# Patient Record
Sex: Male | Born: 1965 | Race: White | Hispanic: No | Marital: Married | Smoking: Never smoker
Health system: Southern US, Community
[De-identification: ages and names within clinical notes are randomized; demographics above are authoritative.]

## PROBLEM LIST (undated history)

## (undated) DIAGNOSIS — C159 Malignant neoplasm of esophagus, unspecified: Secondary | ICD-10-CM

## (undated) DIAGNOSIS — C801 Malignant (primary) neoplasm, unspecified: Secondary | ICD-10-CM

## (undated) DIAGNOSIS — J302 Other seasonal allergic rhinitis: Secondary | ICD-10-CM

## (undated) DIAGNOSIS — M19019 Primary osteoarthritis, unspecified shoulder: Secondary | ICD-10-CM

## (undated) DIAGNOSIS — T66XXXA Radiation sickness, unspecified, initial encounter: Secondary | ICD-10-CM

## (undated) DIAGNOSIS — I10 Essential (primary) hypertension: Secondary | ICD-10-CM

## (undated) DIAGNOSIS — M7541 Impingement syndrome of right shoulder: Secondary | ICD-10-CM

## (undated) DIAGNOSIS — M75110 Incomplete rotator cuff tear or rupture of unspecified shoulder, not specified as traumatic: Secondary | ICD-10-CM

## (undated) HISTORY — DX: Malignant neoplasm of esophagus, unspecified: C15.9

---

## 2002-10-22 DIAGNOSIS — T66XXXA Radiation sickness, unspecified, initial encounter: Secondary | ICD-10-CM

## 2002-10-22 HISTORY — DX: Radiation sickness, unspecified, initial encounter: T66.XXXA

## 2002-10-22 HISTORY — PX: THORACOTOMY: SHX5074

## 2002-10-22 HISTORY — PX: UPPER GASTROINTESTINAL ENDOSCOPY: SHX188

## 2002-10-22 HISTORY — PX: PORT-A-CATH REMOVAL: SHX5289

## 2003-02-25 ENCOUNTER — Encounter: Admission: RE | Admit: 2003-02-25 | Discharge: 2003-02-25 | Payer: Self-pay | Admitting: Internal Medicine

## 2003-02-25 ENCOUNTER — Encounter: Payer: Self-pay | Admitting: Internal Medicine

## 2003-02-26 ENCOUNTER — Encounter: Payer: Self-pay | Admitting: Gastroenterology

## 2003-02-26 ENCOUNTER — Ambulatory Visit (HOSPITAL_COMMUNITY): Admission: RE | Admit: 2003-02-26 | Discharge: 2003-02-26 | Payer: Self-pay | Admitting: Gastroenterology

## 2003-03-23 ENCOUNTER — Encounter: Payer: Self-pay | Admitting: Gastroenterology

## 2003-03-23 ENCOUNTER — Inpatient Hospital Stay (HOSPITAL_COMMUNITY): Admission: EM | Admit: 2003-03-23 | Discharge: 2003-04-09 | Payer: Self-pay | Admitting: Emergency Medicine

## 2003-03-24 ENCOUNTER — Encounter: Payer: Self-pay | Admitting: Gastroenterology

## 2003-03-25 ENCOUNTER — Encounter: Payer: Self-pay | Admitting: Gastroenterology

## 2003-03-26 ENCOUNTER — Encounter: Payer: Self-pay | Admitting: Gastroenterology

## 2003-03-28 ENCOUNTER — Encounter: Payer: Self-pay | Admitting: Gastroenterology

## 2003-03-29 ENCOUNTER — Encounter (INDEPENDENT_AMBULATORY_CARE_PROVIDER_SITE_OTHER): Payer: Self-pay | Admitting: *Deleted

## 2003-03-29 ENCOUNTER — Encounter: Payer: Self-pay | Admitting: Thoracic Surgery

## 2003-03-30 ENCOUNTER — Encounter: Payer: Self-pay | Admitting: Thoracic Surgery

## 2003-03-31 ENCOUNTER — Encounter: Payer: Self-pay | Admitting: Thoracic Surgery

## 2003-04-01 ENCOUNTER — Encounter: Payer: Self-pay | Admitting: Thoracic Surgery

## 2003-04-02 ENCOUNTER — Encounter: Payer: Self-pay | Admitting: Thoracic Surgery

## 2003-04-03 ENCOUNTER — Encounter: Payer: Self-pay | Admitting: Thoracic Surgery

## 2003-04-04 ENCOUNTER — Encounter: Payer: Self-pay | Admitting: Thoracic Surgery

## 2003-04-05 ENCOUNTER — Encounter: Payer: Self-pay | Admitting: Thoracic Surgery

## 2003-04-06 ENCOUNTER — Encounter: Payer: Self-pay | Admitting: Thoracic Surgery

## 2003-04-07 ENCOUNTER — Encounter: Payer: Self-pay | Admitting: Thoracic Surgery

## 2003-04-08 ENCOUNTER — Encounter: Payer: Self-pay | Admitting: Thoracic Surgery

## 2003-04-09 ENCOUNTER — Encounter: Payer: Self-pay | Admitting: Thoracic Surgery

## 2003-04-20 ENCOUNTER — Encounter: Payer: Self-pay | Admitting: Thoracic Surgery

## 2003-04-20 ENCOUNTER — Encounter: Admission: RE | Admit: 2003-04-20 | Discharge: 2003-04-20 | Payer: Self-pay | Admitting: Thoracic Surgery

## 2003-04-29 ENCOUNTER — Encounter: Payer: Self-pay | Admitting: Thoracic Surgery

## 2003-04-30 ENCOUNTER — Encounter: Payer: Self-pay | Admitting: Thoracic Surgery

## 2003-04-30 ENCOUNTER — Ambulatory Visit (HOSPITAL_COMMUNITY): Admission: RE | Admit: 2003-04-30 | Discharge: 2003-04-30 | Payer: Self-pay | Admitting: Thoracic Surgery

## 2003-05-10 ENCOUNTER — Ambulatory Visit (HOSPITAL_COMMUNITY): Admission: RE | Admit: 2003-05-10 | Discharge: 2003-05-10 | Payer: Self-pay | Admitting: Thoracic Surgery

## 2003-05-10 ENCOUNTER — Encounter: Payer: Self-pay | Admitting: Thoracic Surgery

## 2003-05-18 ENCOUNTER — Encounter: Payer: Self-pay | Admitting: Thoracic Surgery

## 2003-05-18 ENCOUNTER — Encounter: Admission: RE | Admit: 2003-05-18 | Discharge: 2003-05-18 | Payer: Self-pay | Admitting: Thoracic Surgery

## 2003-06-29 ENCOUNTER — Encounter: Admission: RE | Admit: 2003-06-29 | Discharge: 2003-06-29 | Payer: Self-pay | Admitting: Thoracic Surgery

## 2003-06-29 ENCOUNTER — Encounter: Payer: Self-pay | Admitting: Thoracic Surgery

## 2003-09-20 ENCOUNTER — Ambulatory Visit (HOSPITAL_COMMUNITY): Admission: RE | Admit: 2003-09-20 | Discharge: 2003-09-20 | Payer: Self-pay | Admitting: Hematology & Oncology

## 2003-09-22 ENCOUNTER — Encounter: Admission: RE | Admit: 2003-09-22 | Discharge: 2003-09-22 | Payer: Self-pay | Admitting: Thoracic Surgery

## 2003-11-08 ENCOUNTER — Ambulatory Visit (HOSPITAL_COMMUNITY): Admission: RE | Admit: 2003-11-08 | Discharge: 2003-11-08 | Payer: Self-pay | Admitting: Hematology & Oncology

## 2003-12-22 ENCOUNTER — Encounter: Admission: RE | Admit: 2003-12-22 | Discharge: 2003-12-22 | Payer: Self-pay | Admitting: Thoracic Surgery

## 2004-01-04 ENCOUNTER — Ambulatory Visit (HOSPITAL_COMMUNITY): Admission: RE | Admit: 2004-01-04 | Discharge: 2004-01-04 | Payer: Self-pay | Admitting: Hematology & Oncology

## 2004-03-23 ENCOUNTER — Encounter: Admission: RE | Admit: 2004-03-23 | Discharge: 2004-03-23 | Payer: Self-pay | Admitting: Thoracic Surgery

## 2004-03-28 ENCOUNTER — Ambulatory Visit (HOSPITAL_COMMUNITY): Admission: RE | Admit: 2004-03-28 | Discharge: 2004-03-28 | Payer: Self-pay | Admitting: Hematology & Oncology

## 2004-07-06 ENCOUNTER — Ambulatory Visit (HOSPITAL_COMMUNITY): Admission: RE | Admit: 2004-07-06 | Discharge: 2004-07-06 | Payer: Self-pay | Admitting: Hematology & Oncology

## 2004-10-05 ENCOUNTER — Ambulatory Visit (HOSPITAL_COMMUNITY): Admission: RE | Admit: 2004-10-05 | Discharge: 2004-10-05 | Payer: Self-pay | Admitting: Hematology & Oncology

## 2004-10-06 ENCOUNTER — Ambulatory Visit: Payer: Self-pay | Admitting: Hematology & Oncology

## 2005-02-08 ENCOUNTER — Ambulatory Visit (HOSPITAL_COMMUNITY): Admission: RE | Admit: 2005-02-08 | Discharge: 2005-02-08 | Payer: Self-pay | Admitting: Hematology & Oncology

## 2005-02-08 ENCOUNTER — Ambulatory Visit: Payer: Self-pay | Admitting: Hematology & Oncology

## 2005-07-12 ENCOUNTER — Ambulatory Visit (HOSPITAL_COMMUNITY): Admission: RE | Admit: 2005-07-12 | Discharge: 2005-07-12 | Payer: Self-pay | Admitting: Hematology & Oncology

## 2005-07-12 ENCOUNTER — Ambulatory Visit: Payer: Self-pay | Admitting: Hematology & Oncology

## 2006-01-23 ENCOUNTER — Ambulatory Visit: Payer: Self-pay | Admitting: Hematology & Oncology

## 2006-01-24 ENCOUNTER — Ambulatory Visit (HOSPITAL_COMMUNITY): Admission: RE | Admit: 2006-01-24 | Discharge: 2006-01-24 | Payer: Self-pay | Admitting: Hematology & Oncology

## 2006-01-25 LAB — LACTATE DEHYDROGENASE: LDH: 132 U/L (ref 94–250)

## 2006-01-25 LAB — COMPREHENSIVE METABOLIC PANEL
ALT: 12 U/L (ref 0–40)
Albumin: 4.3 g/dL (ref 3.5–5.2)
CO2: 27 mEq/L (ref 19–32)
Calcium: 9.2 mg/dL (ref 8.4–10.5)
Chloride: 105 mEq/L (ref 96–112)
Glucose, Bld: 124 mg/dL — ABNORMAL HIGH (ref 70–99)
Sodium: 140 mEq/L (ref 135–145)
Total Bilirubin: 0.4 mg/dL (ref 0.3–1.2)
Total Protein: 7.4 g/dL (ref 6.0–8.3)

## 2006-01-25 LAB — CBC WITH DIFFERENTIAL/PLATELET
BASO%: 0.4 % (ref 0.0–2.0)
Eosinophils Absolute: 0.3 10*3/uL (ref 0.0–0.5)
HCT: 44.4 % (ref 38.7–49.9)
LYMPH%: 19.9 % (ref 14.0–48.0)
MONO#: 0.5 10*3/uL (ref 0.1–0.9)
NEUT#: 5.2 10*3/uL (ref 1.5–6.5)
NEUT%: 68.4 % (ref 40.0–75.0)
Platelets: 349 10*3/uL (ref 145–400)
RBC: 5.49 10*6/uL (ref 4.20–5.71)
WBC: 7.6 10*3/uL (ref 4.0–10.0)
lymph#: 1.5 10*3/uL (ref 0.9–3.3)

## 2006-07-25 ENCOUNTER — Ambulatory Visit: Payer: Self-pay | Admitting: Hematology & Oncology

## 2006-07-25 ENCOUNTER — Ambulatory Visit (HOSPITAL_COMMUNITY): Admission: RE | Admit: 2006-07-25 | Discharge: 2006-07-25 | Payer: Self-pay | Admitting: Hematology & Oncology

## 2007-01-22 ENCOUNTER — Ambulatory Visit (HOSPITAL_COMMUNITY): Admission: RE | Admit: 2007-01-22 | Discharge: 2007-01-22 | Payer: Self-pay | Admitting: Hematology & Oncology

## 2007-02-05 ENCOUNTER — Ambulatory Visit: Payer: Self-pay | Admitting: Hematology & Oncology

## 2007-02-10 LAB — COMPREHENSIVE METABOLIC PANEL
ALT: 16 U/L (ref 0–53)
AST: 14 U/L (ref 0–37)
Alkaline Phosphatase: 92 U/L (ref 39–117)
BUN: 20 mg/dL (ref 6–23)
Calcium: 9.5 mg/dL (ref 8.4–10.5)
Creatinine, Ser: 1.25 mg/dL (ref 0.40–1.50)
Total Bilirubin: 0.5 mg/dL (ref 0.3–1.2)

## 2007-02-10 LAB — CBC WITH DIFFERENTIAL/PLATELET
BASO%: 0.7 % (ref 0.0–2.0)
Basophils Absolute: 0.1 10*3/uL (ref 0.0–0.1)
EOS%: 5.4 % (ref 0.0–7.0)
HCT: 43.9 % (ref 38.7–49.9)
HGB: 15.5 g/dL (ref 13.0–17.1)
LYMPH%: 19.7 % (ref 14.0–48.0)
MCH: 28.1 pg (ref 28.0–33.4)
MCHC: 35.3 g/dL (ref 32.0–35.9)
MCV: 79.8 fL — ABNORMAL LOW (ref 81.6–98.0)
MONO%: 5.7 % (ref 0.0–13.0)
NEUT%: 68.5 % (ref 40.0–75.0)
Platelets: 317 10*3/uL (ref 145–400)
lymph#: 1.5 10*3/uL (ref 0.9–3.3)

## 2007-03-25 ENCOUNTER — Ambulatory Visit: Payer: Self-pay | Admitting: Hematology & Oncology

## 2007-04-04 IMAGING — CT CT CHEST W/ CM
1 of 3 series · 15 of 30 positions shown, 19 images · IV contrast (omnipaque)
Comparison: 02/08/05.
 CHEST CT WITH CONTRAST:

CLINICAL DATA: Esophageal cancer.
TECHNIQUE: Multidetector CT imaging of the chest was performed following the standard protocol during the bolus administration of intravenous contrast.
 Contrast:   125 cc Omnipaque 300.
TECHNIQUE: Multidetector CT imaging of the abdomen was performed following the standard protocol during bolus administration of intravenous contrast.
 The liver, spleen, duodenum, pancreas, and adrenal glands have normal features.  Patient has multiple gas-filled cholesterol stones in the gallbladder lumen.  Kidneys re unremarkable.  No evidence for free fluid, lymphadenopathy, or abdominal aortic aneurysm.
TECHNIQUE: Multidetector CT imaging of the pelvis was performed following the standard protocol during bolus administration of intravenous contrast.

[Series 2: cap 5.0 b40f st · axial · 0.79mm/px · z∈[-633,-73]mm · 15 of 130 slices shown, 19 images]
[im 9/130  mediastinal]
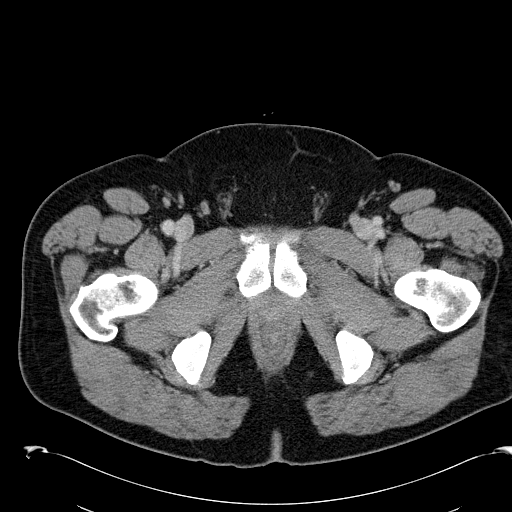
[im 9/130  lung]
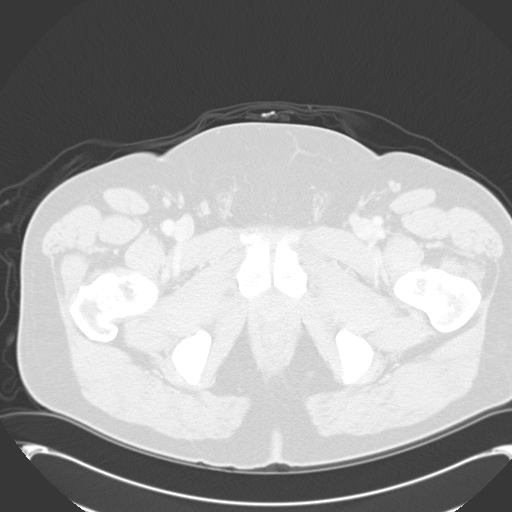
[im 17/130  lung]
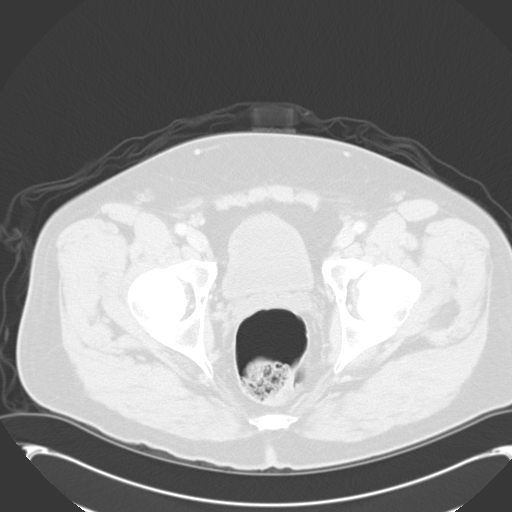
[im 25/130  lung]
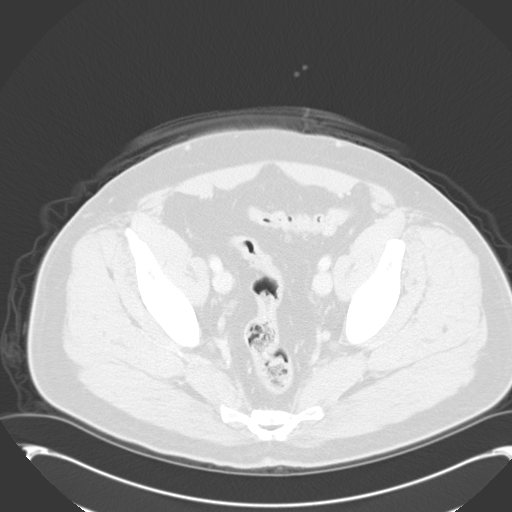
[im 33/130  lung]
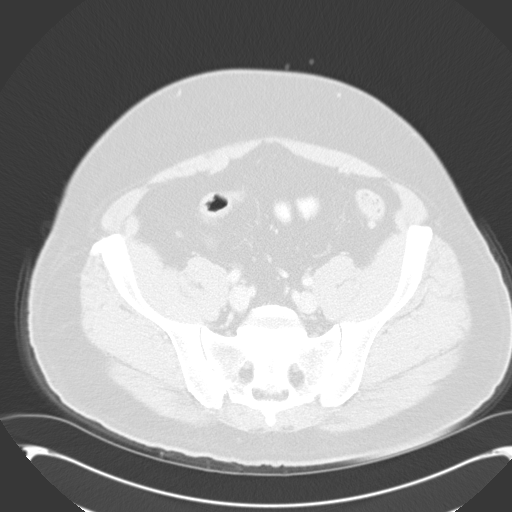
[im 41/130  mediastinal]
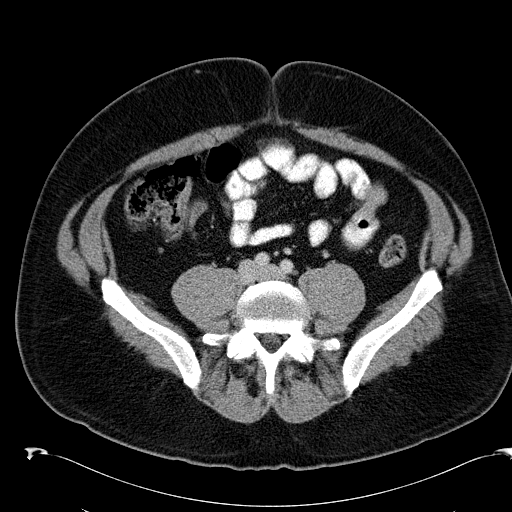
[im 41/130  lung]
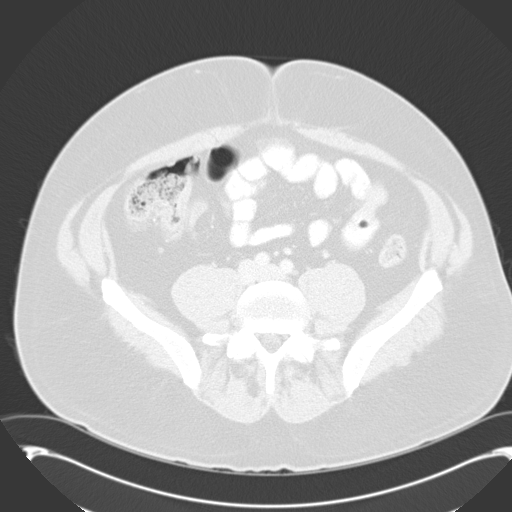
[im 49/130  lung]
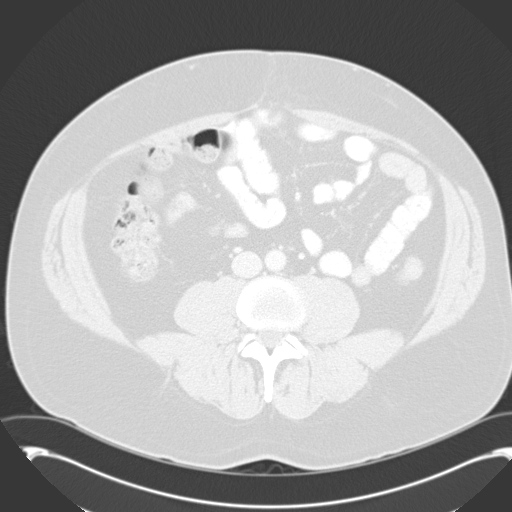
[im 57/130  lung]
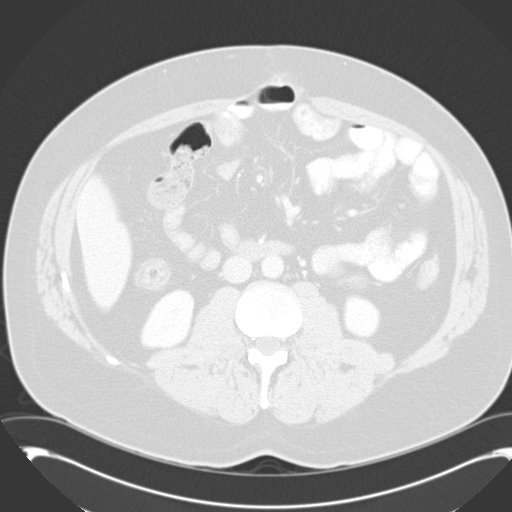
[im 65/130  lung]
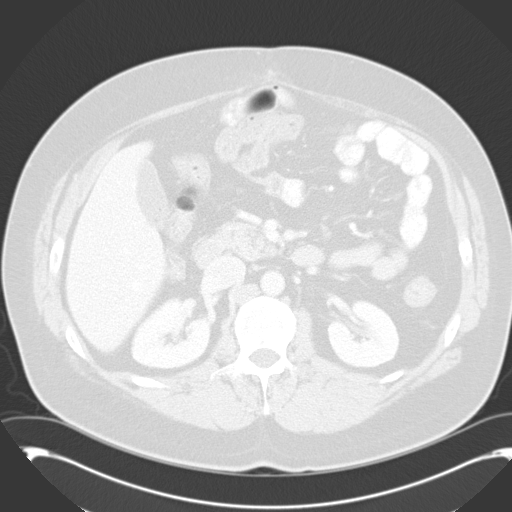
[im 73/130  mediastinal]
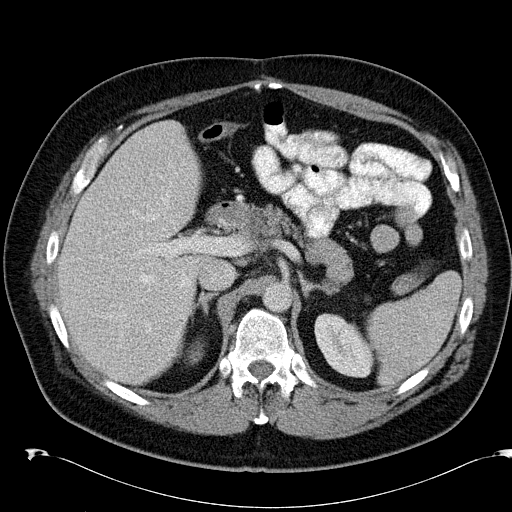
[im 73/130  lung]
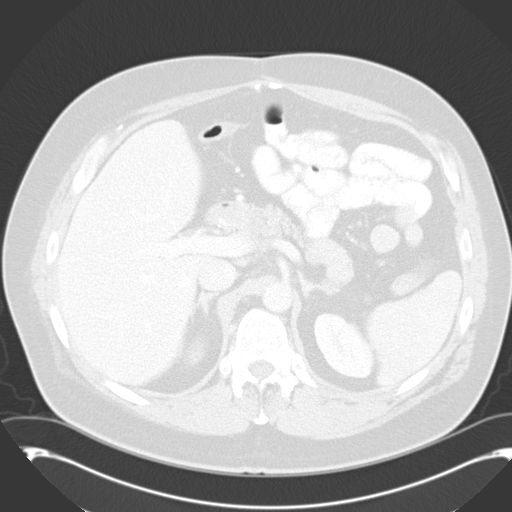
[im 81/130  lung]
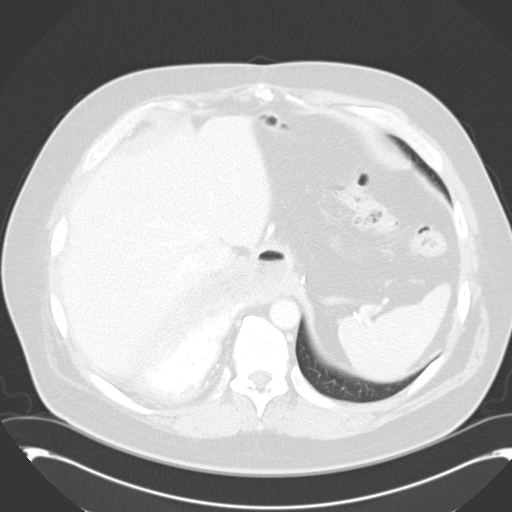
[im 89/130  lung]
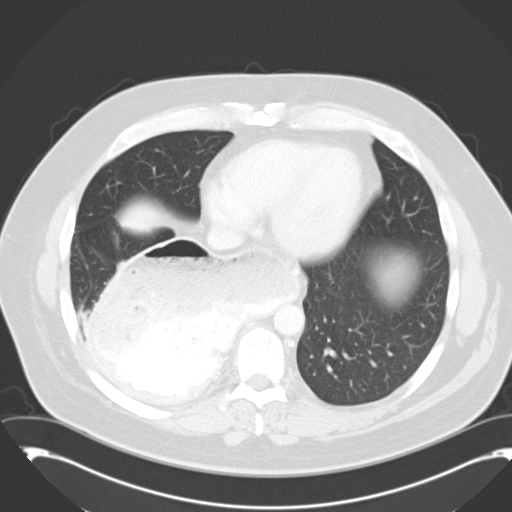
[im 97/130  lung]
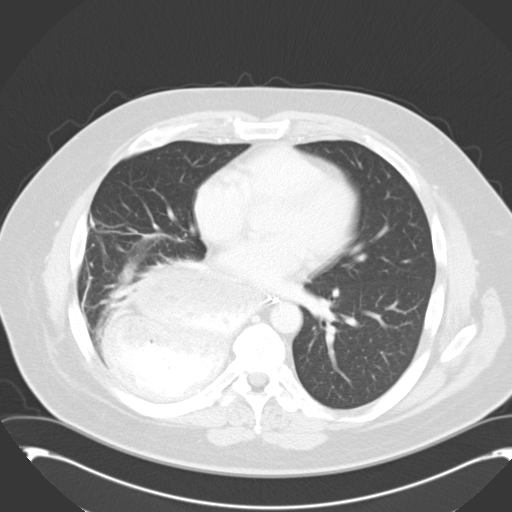
[im 105/130  mediastinal]
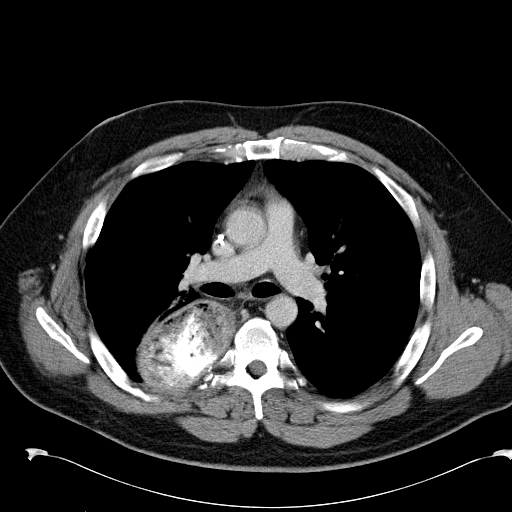
[im 105/130  lung]
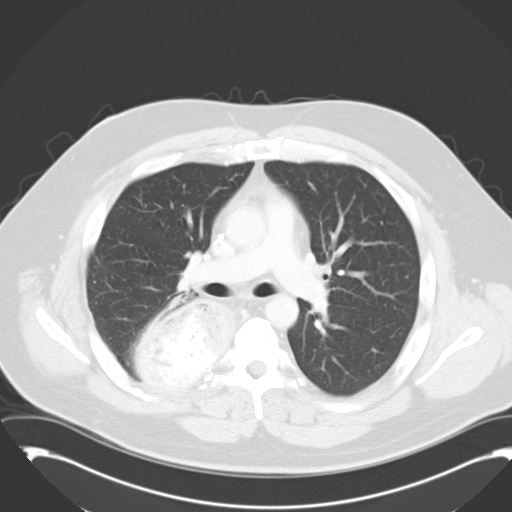
[im 113/130  lung]
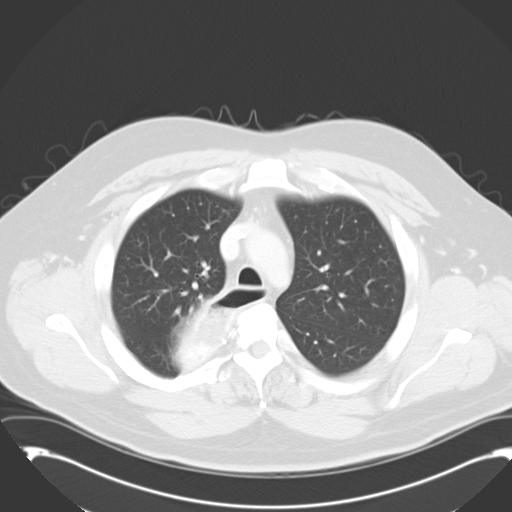
[im 121/130  lung]
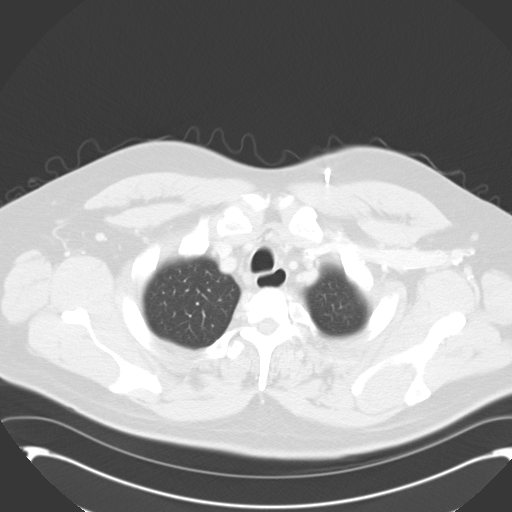

[15 of 30 positions shown; findings below may reference images not displayed]

FINDINGS: Left sided Port-A-Cath again noted.  No evidence for axillary lymphadenopathy.  No abnormal lymphadenopathy is seen in the mediastinum or either hilum.  The patient has had a gastric pull through procedure with no evidence for abnormal soft tissue attenuation adjacent to the stomach.  The heart size is normal.  No evidence for pericardial or pleural effusion.  
 Lung windows reveal small amount of atelectasis or linear scar in the right lower lobe, adjacent to the pull through.  No parenchymal nodules or masses are identified.
IMPRESSION: Patient is status post gastric pull through procedure.  The exam is stable without evidence for metastatic disease in the chest.  
 ABDOMEN CT WITH CONTRAST:
IMPRESSION: 1.  Cholelithiasis.  
 2.  No evidence for metastatic disease in the abdomen.
 PELVIS CT WITH CONTRAST:
FINDINGS: No free fluid or lymphadenopathy.  Diverticular change characterizes the colon diffusely with a sigmoid predominance.  There is no evidence for diverticulitis.  Terminal ileum is unremarkable.  The appendix is not visualized.
IMPRESSION: Stable exam.  No evidence for metastatic involvement in the pelvis.

## 2007-06-18 ENCOUNTER — Ambulatory Visit: Payer: Self-pay | Admitting: Hematology & Oncology

## 2007-08-07 ENCOUNTER — Ambulatory Visit (HOSPITAL_COMMUNITY): Admission: RE | Admit: 2007-08-07 | Discharge: 2007-08-07 | Payer: Self-pay | Admitting: Hematology & Oncology

## 2007-08-25 ENCOUNTER — Ambulatory Visit: Payer: Self-pay | Admitting: Hematology & Oncology

## 2007-08-27 LAB — CBC WITH DIFFERENTIAL/PLATELET
BASO%: 0.3 % (ref 0.0–2.0)
Basophils Absolute: 0 10*3/uL (ref 0.0–0.1)
EOS%: 4.5 % (ref 0.0–7.0)
HCT: 45.2 % (ref 38.7–49.9)
HGB: 15.8 g/dL (ref 13.0–17.1)
LYMPH%: 21.3 % (ref 14.0–48.0)
MCH: 28.3 pg (ref 28.0–33.4)
MCHC: 34.9 g/dL (ref 32.0–35.9)
MONO#: 0.7 10*3/uL (ref 0.1–0.9)
NEUT%: 64.8 % (ref 40.0–75.0)
Platelets: 315 10*3/uL (ref 145–400)
lymph#: 1.7 10*3/uL (ref 0.9–3.3)

## 2007-08-27 LAB — COMPREHENSIVE METABOLIC PANEL
BUN: 22 mg/dL (ref 6–23)
CO2: 25 mEq/L (ref 19–32)
Calcium: 9.6 mg/dL (ref 8.4–10.5)
Chloride: 103 mEq/L (ref 96–112)
Creatinine, Ser: 1.32 mg/dL (ref 0.40–1.50)
Total Bilirubin: 0.5 mg/dL (ref 0.3–1.2)

## 2008-02-26 ENCOUNTER — Ambulatory Visit (HOSPITAL_COMMUNITY): Admission: RE | Admit: 2008-02-26 | Discharge: 2008-02-26 | Payer: Self-pay | Admitting: Hematology & Oncology

## 2008-03-02 ENCOUNTER — Ambulatory Visit: Payer: Self-pay | Admitting: Hematology & Oncology

## 2008-03-04 LAB — CBC WITH DIFFERENTIAL/PLATELET
Basophils Absolute: 0 10*3/uL (ref 0.0–0.1)
Eosinophils Absolute: 0.5 10*3/uL (ref 0.0–0.5)
HCT: 44 % (ref 38.7–49.9)
HGB: 15.2 g/dL (ref 13.0–17.1)
LYMPH%: 20.6 % (ref 14.0–48.0)
MONO#: 0.5 10*3/uL (ref 0.1–0.9)
NEUT#: 5 10*3/uL (ref 1.5–6.5)
NEUT%: 66.3 % (ref 40.0–75.0)
Platelets: 336 10*3/uL (ref 145–400)
WBC: 7.5 10*3/uL (ref 4.0–10.0)

## 2008-03-04 LAB — COMPREHENSIVE METABOLIC PANEL
CO2: 25 mEq/L (ref 19–32)
Calcium: 8.7 mg/dL (ref 8.4–10.5)
Chloride: 103 mEq/L (ref 96–112)
Creatinine, Ser: 1.25 mg/dL (ref 0.40–1.50)
Glucose, Bld: 122 mg/dL — ABNORMAL HIGH (ref 70–99)
Total Bilirubin: 0.4 mg/dL (ref 0.3–1.2)

## 2008-04-02 ENCOUNTER — Ambulatory Visit (HOSPITAL_COMMUNITY): Admission: RE | Admit: 2008-04-02 | Discharge: 2008-04-02 | Payer: Self-pay | Admitting: Thoracic Surgery

## 2008-04-13 ENCOUNTER — Ambulatory Visit: Payer: Self-pay | Admitting: Thoracic Surgery

## 2008-04-15 ENCOUNTER — Ambulatory Visit: Payer: Self-pay | Admitting: Thoracic Surgery

## 2008-08-26 ENCOUNTER — Ambulatory Visit (HOSPITAL_COMMUNITY): Admission: RE | Admit: 2008-08-26 | Discharge: 2008-08-26 | Payer: Self-pay | Admitting: Hematology & Oncology

## 2008-09-01 ENCOUNTER — Ambulatory Visit: Payer: Self-pay | Admitting: Hematology & Oncology

## 2008-09-02 LAB — CBC WITH DIFFERENTIAL (CANCER CENTER ONLY)
BASO#: 0.1 10*3/uL (ref 0.0–0.2)
BASO%: 0.7 % (ref 0.0–2.0)
EOS%: 5.7 % (ref 0.0–7.0)
Eosinophils Absolute: 0.4 10*3/uL (ref 0.0–0.5)
HCT: 46.8 % (ref 38.7–49.9)
HGB: 15.9 g/dL (ref 13.0–17.1)
LYMPH#: 1.5 10*3/uL (ref 0.9–3.3)
LYMPH%: 20.4 % (ref 14.0–48.0)
MCH: 27.9 pg — ABNORMAL LOW (ref 28.0–33.4)
MCHC: 34.1 g/dL (ref 32.0–35.9)
MCV: 82 fL (ref 82–98)
MONO#: 0.4 10*3/uL (ref 0.1–0.9)
MONO%: 5.3 % (ref 0.0–13.0)
NEUT#: 4.9 10*3/uL (ref 1.5–6.5)
NEUT%: 67.9 % (ref 40.0–80.0)
Platelets: 331 10*3/uL (ref 145–400)
RBC: 5.72 10*6/uL — ABNORMAL HIGH (ref 4.20–5.70)
RDW: 13 % (ref 10.5–14.6)
WBC: 7.2 10*3/uL (ref 4.0–10.0)

## 2008-09-02 LAB — COMPREHENSIVE METABOLIC PANEL
ALT: 19 U/L (ref 0–53)
CO2: 27 mEq/L (ref 19–32)
Calcium: 9.6 mg/dL (ref 8.4–10.5)
Chloride: 104 mEq/L (ref 96–112)
Glucose, Bld: 102 mg/dL — ABNORMAL HIGH (ref 70–99)
Sodium: 140 mEq/L (ref 135–145)
Total Protein: 7.5 g/dL (ref 6.0–8.3)

## 2008-09-02 LAB — LACTATE DEHYDROGENASE: LDH: 149 U/L (ref 94–250)

## 2008-10-14 IMAGING — CT CT PELVIS W/ CM
2 of 5 series · 16 of 46 positions shown, 18 images · IV contrast (omnipaque)
Comparison: 07/25/06.
COMPARISON: 07/25/06.

CLINICAL DATA: Esophageal ca.  Chemotherapy complete. 
CHEST CT WITH CONTRAST:
TECHNIQUE: Multidetector CT imaging of the chest was performed following the standard protocol during bolus administration of intravenous contrast.
Contrast:  125 cc Omnipaque 300
TECHNIQUE: Multidetector CT imaging of the abdomen was performed following the standard protocol during bolus administration of intravenous contrast.
TECHNIQUE: Multidetector CT imaging of the pelvis was performed following the standard protocol during bolus administration of intravenous contrast.

[Series 2: cap 5.0 b40f · axial · 0.88mm/px · z∈[-686,-106]mm · 13 of 132 slices shown, 15 images]
[im 8/132  soft-tissue]
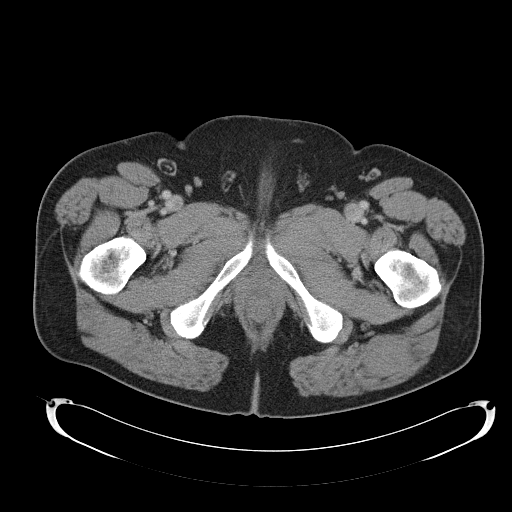
[im 8/132  bone]
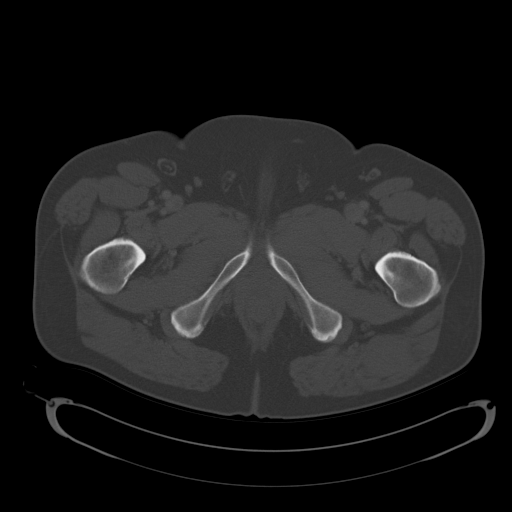
[im 15/132  soft-tissue]
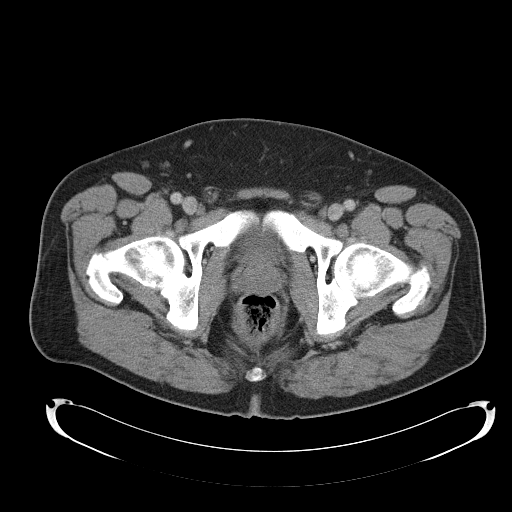
[im 30/132  soft-tissue]
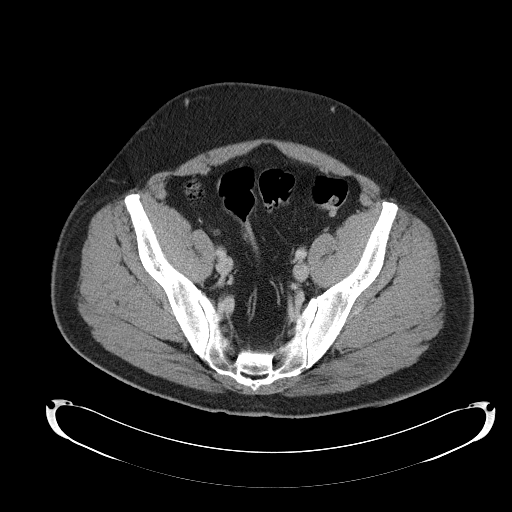
[im 37/132  soft-tissue]
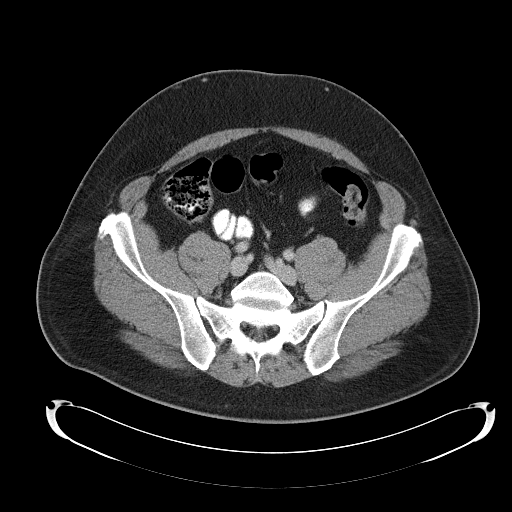
[im 44/132  soft-tissue]
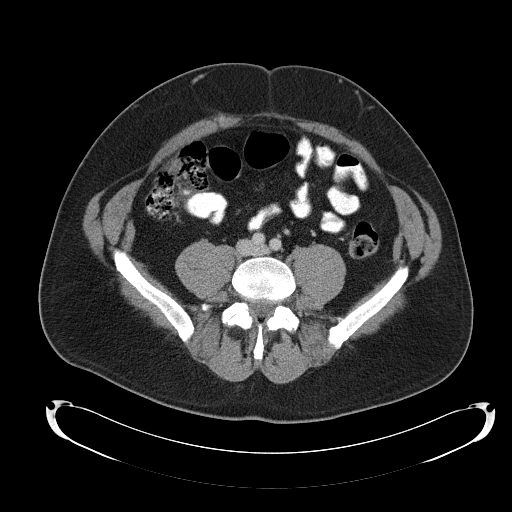
[im 59/132  soft-tissue]
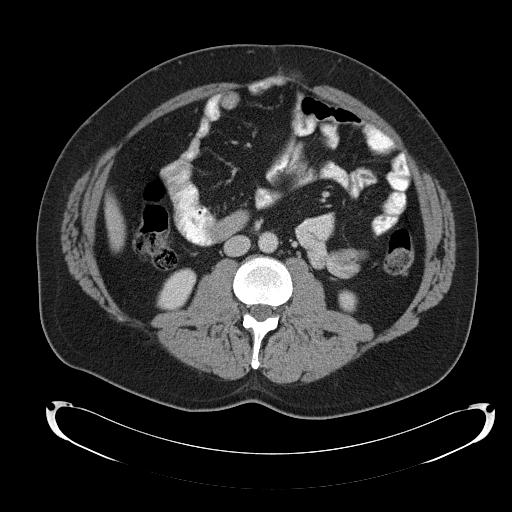
[im 66/132  soft-tissue]
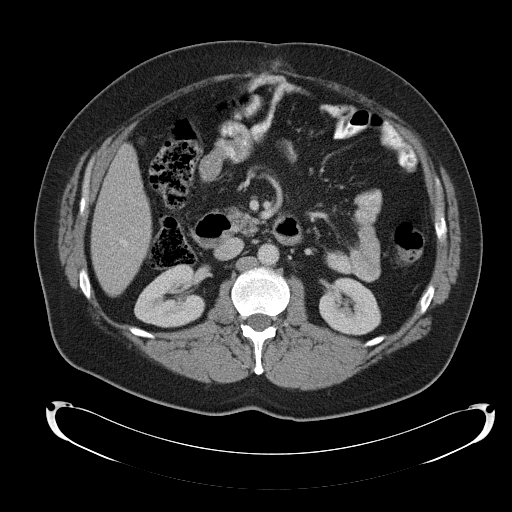
[im 73/132  soft-tissue]
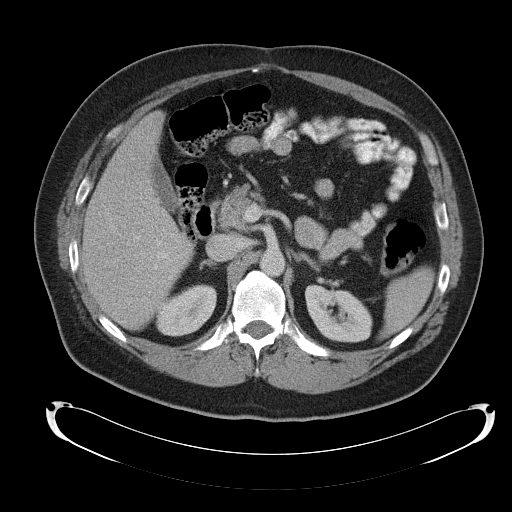
[im 88/132  soft-tissue]
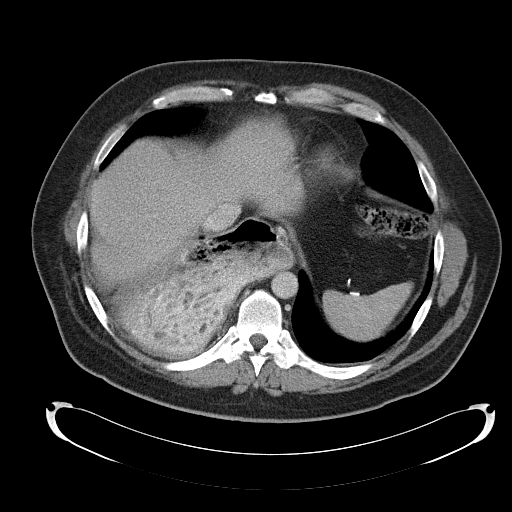
[im 88/132  bone]
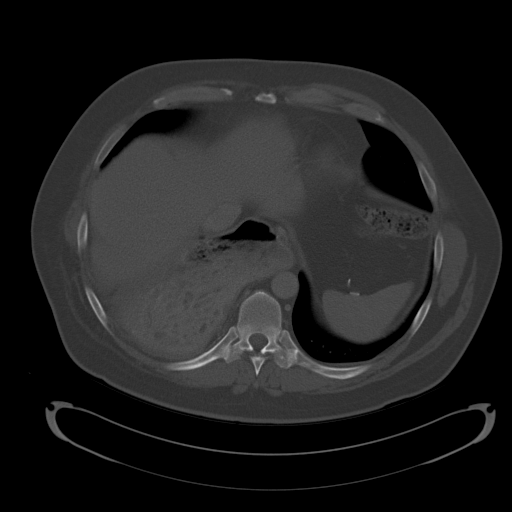
[im 95/132  soft-tissue]
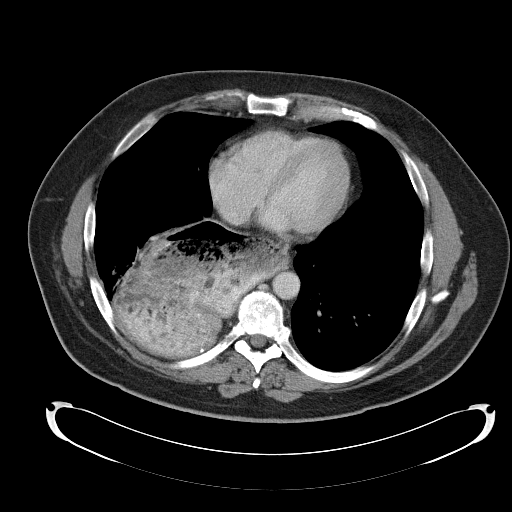
[im 102/132  soft-tissue]
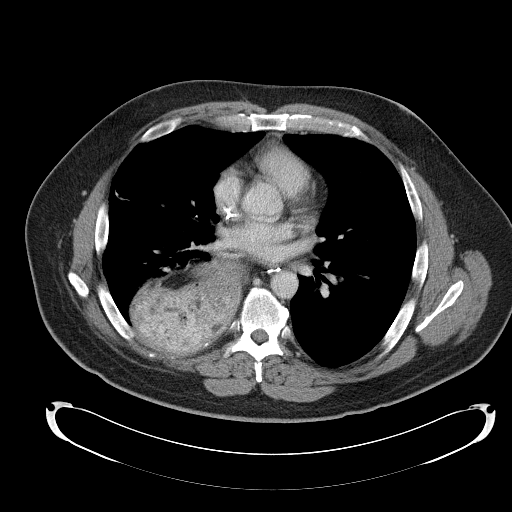
[im 117/132  soft-tissue]
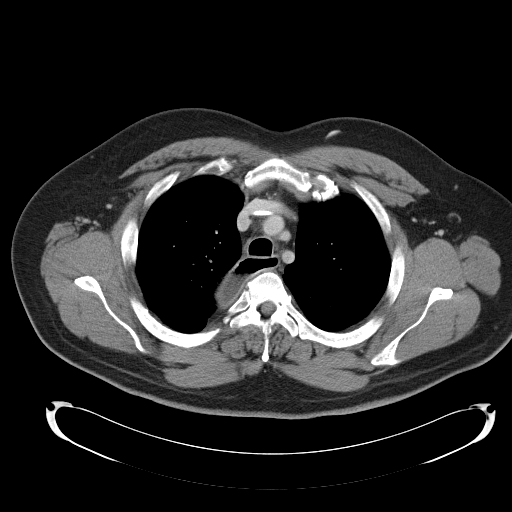
[im 124/132  soft-tissue]
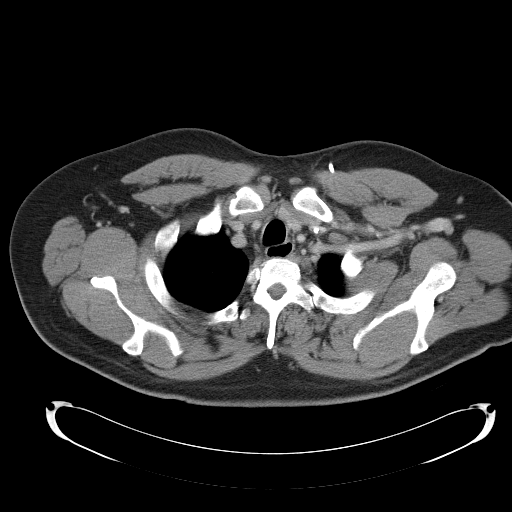

[Series 602: <mpr thick range> · coronal · 1.28mm/px · 3 of 91 slices shown]
[im 31/91  soft-tissue]
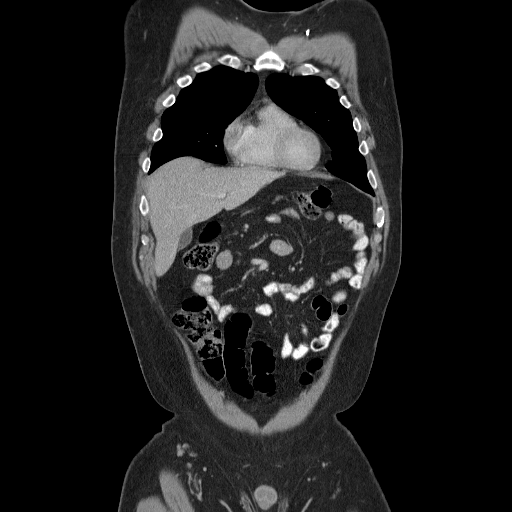
[im 41/91  soft-tissue]
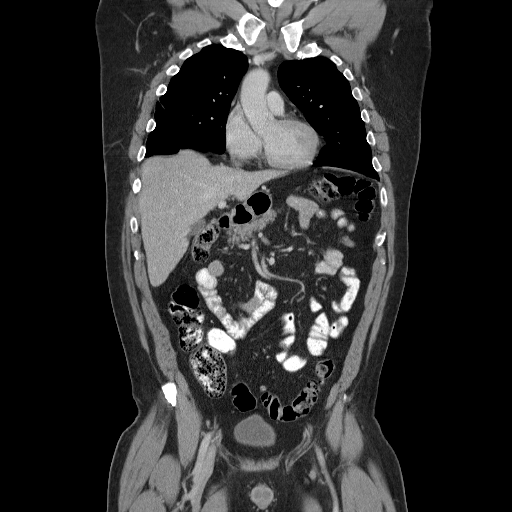
[im 51/91  soft-tissue]
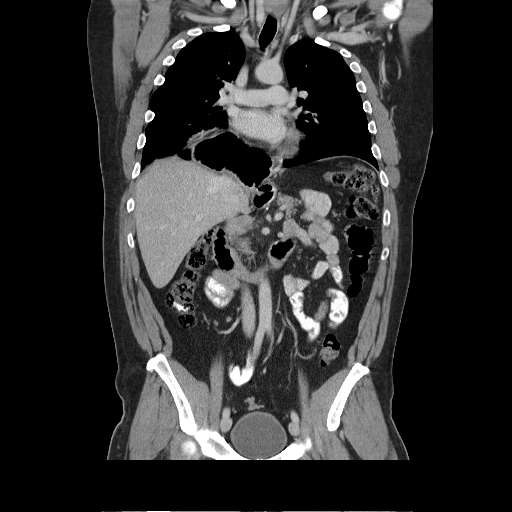

[16 of 46 positions shown; findings below may reference images not displayed]

FINDINGS: Port-a-cath is noted extending into the SVC with the tip near the SVC/right atrial junction.  Findings of gastric pull-through surgery with the stomach in the right middle and posterior mediastinum.  No evidence of a mass or pathologically enlarged lymph nodes.  0.7 x 1.0 cm lymph node in the left paratracheal region as noted on today?s image #12.  Iz is stable in size.  Negative for metastatic involvement of the lungs.
IMPRESSION: Stable chest.  No findings to suggest metastatic disease.  
ABDOMEN CT WITH CONTRAST:
FINDINGS: Negative for metastatic involvement of the liver, spleen, or adrenal glands.  The kidneys and pancreas appear unremarkable.  No pathologically enlarged lymph nodes.  Two small ventral hernias appear stable.  There is a knuckle of contrast filled small bowel extending into the left paramedian hernia.
IMPRESSION: Negative for metastatic involvement of the abdomen.  
PELVIS CT WITH CONTRAST:
FINDINGS: No pelvic mass or pathologically enlarged lymph nodes.  Prostate gland appears slightly prominent in size having a maximum transverse diameter of 4.0 cm.  Seminal vesicles unremarkable. 
No evidence of metastatic involvement of the osseous structures of the chest, abdomen, or pelvis.
IMPRESSION: Negative for metastatic involvement of the pelvis.

## 2009-02-23 ENCOUNTER — Ambulatory Visit: Payer: Self-pay | Admitting: Hematology & Oncology

## 2009-02-25 ENCOUNTER — Ambulatory Visit: Payer: Self-pay | Admitting: Diagnostic Radiology

## 2009-02-25 ENCOUNTER — Ambulatory Visit (HOSPITAL_BASED_OUTPATIENT_CLINIC_OR_DEPARTMENT_OTHER): Admission: RE | Admit: 2009-02-25 | Discharge: 2009-02-25 | Payer: Self-pay | Admitting: Hematology & Oncology

## 2009-03-03 LAB — CBC WITH DIFFERENTIAL (CANCER CENTER ONLY)
BASO#: 0 10*3/uL (ref 0.0–0.2)
EOS%: 7.7 % — ABNORMAL HIGH (ref 0.0–7.0)
HCT: 46.2 % (ref 38.7–49.9)
HGB: 15.7 g/dL (ref 13.0–17.1)
LYMPH#: 1.6 10*3/uL (ref 0.9–3.3)
LYMPH%: 21.8 % (ref 14.0–48.0)
MCH: 28.3 pg (ref 28.0–33.4)
MCHC: 33.9 g/dL (ref 32.0–35.9)
MCV: 84 fL (ref 82–98)
MONO%: 4.5 % (ref 0.0–13.0)
NEUT%: 65.4 % (ref 40.0–80.0)

## 2009-03-03 LAB — COMPREHENSIVE METABOLIC PANEL
ALT: 18 U/L (ref 0–53)
AST: 16 U/L (ref 0–37)
BUN: 16 mg/dL (ref 6–23)
CO2: 27 mEq/L (ref 19–32)
Calcium: 9.2 mg/dL (ref 8.4–10.5)
Chloride: 103 mEq/L (ref 96–112)
Creatinine, Ser: 1.38 mg/dL (ref 0.40–1.50)
Total Bilirubin: 0.4 mg/dL (ref 0.3–1.2)

## 2009-08-23 ENCOUNTER — Ambulatory Visit: Payer: Self-pay | Admitting: Hematology & Oncology

## 2009-08-24 ENCOUNTER — Ambulatory Visit: Payer: Self-pay | Admitting: Diagnostic Radiology

## 2009-08-24 ENCOUNTER — Ambulatory Visit (HOSPITAL_BASED_OUTPATIENT_CLINIC_OR_DEPARTMENT_OTHER): Admission: RE | Admit: 2009-08-24 | Discharge: 2009-08-24 | Payer: Self-pay | Admitting: Hematology & Oncology

## 2009-08-24 LAB — CBC WITH DIFFERENTIAL (CANCER CENTER ONLY)
BASO#: 0 10*3/uL (ref 0.0–0.2)
Eosinophils Absolute: 0.4 10*3/uL (ref 0.0–0.5)
HCT: 44.7 % (ref 38.7–49.9)
HGB: 15.3 g/dL (ref 13.0–17.1)
LYMPH#: 1.4 10*3/uL (ref 0.9–3.3)
LYMPH%: 18.9 % (ref 14.0–48.0)
MCV: 83 fL (ref 82–98)
MONO#: 0.4 10*3/uL (ref 0.1–0.9)
NEUT%: 69.2 % (ref 40.0–80.0)
WBC: 7.5 10*3/uL (ref 4.0–10.0)

## 2009-08-24 LAB — COMPREHENSIVE METABOLIC PANEL
ALT: 11 U/L (ref 0–53)
AST: 12 U/L (ref 0–37)
Albumin: 4.3 g/dL (ref 3.5–5.2)
Alkaline Phosphatase: 78 U/L (ref 39–117)
BUN: 17 mg/dL (ref 6–23)
Calcium: 8.9 mg/dL (ref 8.4–10.5)
Chloride: 104 mEq/L (ref 96–112)
Potassium: 4.3 mEq/L (ref 3.5–5.3)
Sodium: 141 mEq/L (ref 135–145)

## 2010-02-21 ENCOUNTER — Ambulatory Visit: Payer: Self-pay | Admitting: Hematology & Oncology

## 2010-02-22 ENCOUNTER — Ambulatory Visit (HOSPITAL_BASED_OUTPATIENT_CLINIC_OR_DEPARTMENT_OTHER): Admission: RE | Admit: 2010-02-22 | Discharge: 2010-02-22 | Payer: Self-pay | Admitting: Hematology & Oncology

## 2010-02-22 ENCOUNTER — Ambulatory Visit: Payer: Self-pay | Admitting: Diagnostic Radiology

## 2010-02-22 LAB — CMP (CANCER CENTER ONLY)
ALT(SGPT): 23 U/L (ref 10–47)
AST: 23 U/L (ref 11–38)
Albumin: 3.8 g/dL (ref 3.3–5.5)
Calcium: 9.4 mg/dL (ref 8.0–10.3)
Chloride: 99 mEq/L (ref 98–108)
Potassium: 4.3 mEq/L (ref 3.3–4.7)
Sodium: 141 mEq/L (ref 128–145)
Total Protein: 8 g/dL (ref 6.4–8.1)

## 2010-02-22 LAB — CBC WITH DIFFERENTIAL (CANCER CENTER ONLY)
BASO%: 0.8 % (ref 0.0–2.0)
EOS%: 7.2 % — ABNORMAL HIGH (ref 0.0–7.0)
LYMPH#: 1.7 10*3/uL (ref 0.9–3.3)
MCHC: 33.2 g/dL (ref 32.0–35.9)
MONO#: 0.4 10*3/uL (ref 0.1–0.9)
NEUT#: 5.3 10*3/uL (ref 1.5–6.5)
RDW: 12.2 % (ref 10.5–14.6)
WBC: 8 10*3/uL (ref 4.0–10.0)

## 2010-08-29 ENCOUNTER — Ambulatory Visit: Payer: Self-pay | Admitting: Hematology & Oncology

## 2010-08-31 LAB — CBC WITH DIFFERENTIAL (CANCER CENTER ONLY)
BASO#: 0.1 10*3/uL (ref 0.0–0.2)
EOS%: 5.4 % (ref 0.0–7.0)
HGB: 15.6 g/dL (ref 13.0–17.1)
MCH: 28 pg (ref 28.0–33.4)
MCHC: 33.4 g/dL (ref 32.0–35.9)
MONO%: 6 % (ref 0.0–13.0)
NEUT#: 5.2 10*3/uL (ref 1.5–6.5)
Platelets: 310 10*3/uL (ref 145–400)
RDW: 11.6 % (ref 10.5–14.6)

## 2010-09-01 LAB — COMPREHENSIVE METABOLIC PANEL
AST: 14 U/L (ref 0–37)
Albumin: 4.4 g/dL (ref 3.5–5.2)
Alkaline Phosphatase: 78 U/L (ref 39–117)
BUN: 15 mg/dL (ref 6–23)
Potassium: 4.1 mEq/L (ref 3.5–5.3)

## 2010-11-11 ENCOUNTER — Encounter: Payer: Self-pay | Admitting: Thoracic Surgery

## 2010-11-12 ENCOUNTER — Encounter: Payer: Self-pay | Admitting: Thoracic Surgery

## 2010-11-12 ENCOUNTER — Encounter: Payer: Self-pay | Admitting: Hematology & Oncology

## 2010-11-13 ENCOUNTER — Encounter: Payer: Self-pay | Admitting: Hematology & Oncology

## 2011-03-01 ENCOUNTER — Encounter (HOSPITAL_BASED_OUTPATIENT_CLINIC_OR_DEPARTMENT_OTHER): Payer: BC Managed Care – PPO | Admitting: Hematology & Oncology

## 2011-03-01 ENCOUNTER — Other Ambulatory Visit: Payer: Self-pay | Admitting: Hematology & Oncology

## 2011-03-01 DIAGNOSIS — C155 Malignant neoplasm of lower third of esophagus: Secondary | ICD-10-CM

## 2011-03-01 LAB — COMPREHENSIVE METABOLIC PANEL
Albumin: 4.4 g/dL (ref 3.5–5.2)
BUN: 14 mg/dL (ref 6–23)
CO2: 24 mEq/L (ref 19–32)
Calcium: 9.4 mg/dL (ref 8.4–10.5)
Chloride: 103 mEq/L (ref 96–112)
Glucose, Bld: 112 mg/dL — ABNORMAL HIGH (ref 70–99)
Potassium: 4.4 mEq/L (ref 3.5–5.3)
Sodium: 141 mEq/L (ref 135–145)
Total Protein: 7.3 g/dL (ref 6.0–8.3)

## 2011-03-01 LAB — CBC WITH DIFFERENTIAL (CANCER CENTER ONLY)
BASO#: 0 10*3/uL (ref 0.0–0.2)
Eosinophils Absolute: 0.6 10*3/uL — ABNORMAL HIGH (ref 0.0–0.5)
HGB: 15.8 g/dL (ref 13.0–17.1)
LYMPH#: 1.4 10*3/uL (ref 0.9–3.3)
MCH: 28.2 pg (ref 28.0–33.4)
MONO#: 0.6 10*3/uL (ref 0.1–0.9)
MONO%: 7.4 % (ref 0.0–13.0)
NEUT#: 4.9 10*3/uL (ref 1.5–6.5)
Platelets: 266 10*3/uL (ref 145–400)
RBC: 5.61 10*6/uL (ref 4.20–5.70)
WBC: 7.4 10*3/uL (ref 4.0–10.0)

## 2011-03-06 NOTE — Op Note (Signed)
NAME:  [name], [name]                   ACCOUNT NO.:  [id number]   MEDICAL RECORD NO.:  [id number]          PATIENT TYPE:  AMB   LOCATION:  SDS                          FACILITY:  [facility]   PHYSICIAN:  Ines Bloomer, M.D. DATE OF BIRTH:  1966-03-20   DATE OF PROCEDURE:  04/02/2008  DATE OF DISCHARGE:                               OPERATIVE REPORT   PREOPERATIVE DIAGNOSIS:  Status post chemotherapy for esophageal cancer.   POSTOPERATIVE DIAGNOSIS:  Status post chemotherapy for esophageal  cancer.   OPERATION PERFORMED:  Removal of Port-A-Cath.   SURGEON:  Ines Bloomer, MD   ANESTHESIA:  IV sedation 1% Xylocaine.   After prepping and draping the left chest, the area was infiltrated with  1% Xylocaine.  A transverse incision was made over the previous  incision.  Dissection was carried down through the subcutaneous tissue  to the Port-A-Cath.  Stitch was removed and the Port-A-Cath removed.  Wound was closed with 3-0 Vicryl and Dermabond for the skin.  The  patient returned to the recovery room in stable condition.      Ines Bloomer, M.D.  Electronically Signed     DPB/MEDQ  D:  04/02/2008  T:  04/02/2008  Job:  [id number]   cc:   Dani Gobble, MD

## 2011-03-06 NOTE — Assessment & Plan Note (Signed)
OFFICE VISIT   [name], [name]  DOB:  03-10-1966                                        April 15, 2008  CHART #:  [id number]   The patient came today.  His Port-A Cath removal was well healed.  His  blood pressure was 180/100, pulse 84, respirations 18, and sats were  98%.  I will see him back again if he has any future problems.  He is  doing well now over 5 years since his surgery.   Ines Bloomer, M.D.  Electronically Signed   DPB/MEDQ  D:  04/15/2008  T:  04/15/2008  Job:  [id number]

## 2011-03-09 NOTE — H&P (Signed)
[name], [name]                             ACCOUNT NO.:  [id number]   MEDICAL RECORD NO.:  [id number]                   PATIENT TYPE:  INP   LOCATION:  1824                                 FACILITY:  [facility]   PHYSICIAN:  James L. Malon Kindle., M.D.          DATE OF BIRTH:  [birth year]   DATE OF ADMISSION:  03/23/2003  DATE OF DISCHARGE:                                HISTORY & PHYSICAL   REASON FOR ADMISSION:  Fever, abdominal pain, elevated white count following  dilatation.   HISTORY:  A 45 year old gentleman with a history of esophageal reflux for  many years, has been on multiple medicines.  He had a upper endoscopy with  biopsy and an esophageal dilatation done today.  This was his third  dilatation in the past month.  Following the dilatation, which was done at  [facility], he felt okay, went home, went to sleep.  When he awoke, was  awakened 2-3 hours later he did not feel well.  He was having some fever and  chills.  No cough.  He was having diffuse pain across his upper abdomen and  around to the right upper quadrant.  It was pleuritic in nature, worse with  taking a deep breath.  He did take some liquids and actually ate a few bites  of spaghetti this did not make the pain any better nor worse.  He had a  bowel movement today.  He has had no chest pain.  Later this afternoon he  developed a fever, at times felt cold and clammy and called with this  history and we had him come to the emergency room.   CURRENT MEDICATIONS:  Aciphex.   ALLERGIES:  He is allergic to TETANUS.   PAST MEDICAL HISTORY:  Esophageal reflux with esophageal stricture.  No  other medical problems.   PAST SURGICAL HISTORY:  He has never had any previous surgery.   FAMILY HISTORY:  His sister had gallstones.  Father died of a heart attack.  Mother died of lung cancer.  There is no other family history of cancer.   SOCIAL HISTORY:  He is married.  He is a Teacher, early years/pre for [employer].  He  drinks  alcohol, 3-4 beers a week.  He does not smoke.   REVIEW OF SYSTEMS:  Completely negative.  He felt fine prior to today.  He  has never had any pulmonary problems.   PHYSICAL EXAMINATION:  VITAL SIGNS:  Temperature 102.2, blood pressure  systolic 135, pulse 94, O2 sat on room air 97%.  GENERAL:  Alert, white male.  HEENT:  Sclerae nonicteric.  Extraocular movements intact.  NECK:  Supple.  I do not feel any crepitus.  LUNGS:  Clear anterior and posterior.  I heard no friction rubs.  Breath  sounds are equal on both sides.  HEART:  Regular, rate and rhythm without murmurs or gallops.  ABDOMEN:  Nondistended.  Bowel sounds are present.  There is some mild  tenderness in the right upper quadrant.  It seems to be more tender when he  is sitting up taking a deep breath.  It extends up into his right lower  chest around to the right flank.  EXTREMITIES:  Good peripheral pulses.   PERTINENT DATA:  White count is 15.9.   Acute abdominal series shows no free air.  The chest x-ray shows no obvious  pneumonia, some atelectasis both lung fields.  There are gallstones in  apparently the right upper quadrant.   ASSESSMENT:  1. Right upper quadrant pain, fever and elevated white count after     dilatation must consider perforated esophagus or stomach or serosal tear.     I suppose it could be aspiration, we see nothing on chest x-ray.  He is     not really having anything in the way of pulmonary symptoms.  Right now     there is no urgent need for surgical intervention.   1. Probable gallstones on the acute abdominal series.  I think this is     incidental unrelated to his chronic symptoms.   PLAN:  We will admit.  We will empirically give IV antibiotics.  Obtain a CT  of the abdomen and chest with IV and oral contrast.  Will have surgery  follow him with [place].                                                James L. Malon Kindle., M.D.    Waldron Session  D:  03/23/2003  T:  03/24/2003  Job:   [id number]   cc:   Tasia Catchings, M.D.  [address]  [place]  [place] [address]  Fax: [phone]   Lorne Skeens. Hoxworth, M.D.  [address]  [place]  [place] [address]  Fax: [phone]

## 2011-03-09 NOTE — Op Note (Signed)
   [name], [name]                             ACCOUNT NO.:  [id number]   MEDICAL RECORD NO.:  [id number]                   PATIENT TYPE:  OIB   LOCATION:  2899                                 FACILITY:  [facility]   PHYSICIAN:  Ines Bloomer, M.D.              DATE OF BIRTH:  05-Feb-1966   DATE OF PROCEDURE:  04/30/2003  DATE OF DISCHARGE:                                 OPERATIVE REPORT   PREOPERATIVE DIAGNOSIS:  Esophageal cancer.   POSTOPERATIVE DIAGNOSIS:  Esophageal cancer.   OPERATION PERFORMED:  Insertion of left subclavian Port-A-Cath.   SURGEON:  Ines Bloomer, M.D.   ANESTHESIA:  1% Xylocaine and IV sedation.   After prepping and draping the left chest, an area was infiltrated with 1%  Xylocaine in the infraclavicular area and a left subclavian puncture was  performed and the guide-wire threaded under fluoroscopic guidance to the  right atrium.  Another area was infiltrated with Xylocaine inferior to this.  A transverse incision was made and the pocket was dissected out.  Into the  pocket was placed the preattached Port-A-Cath and was sutured into place  with 2-0 silk.  The tubing was then tunneled through the guide-wire, a stab  wound had been made around the guide-wire.  The tunneling was done.  The  tubing was measured appropriately to reach the right atrium and then cut and  this was done with fluoroscopy guidance.  Over the guide-wire was passed the  dilator with peel away sheath.  The guide-wire and the dilator were removed  and through the peel away sheath was passed the tubing.  The peel away  sheath was removed.  A Huger needle was inserted and the Port-A-Cath flushed  easily and withdrew blood easily.  The wound was closed with 3-0 Vicryl in  the subcutaneous tissue and Dermabond for the skin.  The patient returned to  the  recovery room in stable condition.  Prior to going to the recovery room, the  patient had had a feeding jejunostomy that was inserted  a month before at  the time of his esophagogastrectomy and the sutures were cut and this was  removed.                                               Ines Bloomer, M.D.    DPB/MEDQ  D:  04/30/2003  T:  04/30/2003  Job:  [id number]   cc:   Tasia Catchings, M.D.  [address]  [place]  [place] [address]  Fax: [phone]   Rose Phi. Myna Hidalgo, M.D.  [address]  [place], [place] [address]  Fax: [phone]

## 2011-03-09 NOTE — Op Note (Signed)
NAMEANIEL, HUBBLE                             ACCOUNT NO.:  [id number]   MEDICAL RECORD NO.:  [id number]                   PATIENT TYPE:  INP   LOCATION:  2312                                 FACILITY:  [facility]   PHYSICIAN:  Ines Bloomer, M.D.              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   POSTOPERATIVE DIAGNOSIS:  Perforation of the esophagus, adenocarcinoma of  the distal esophagus.   OPERATION:  Exploratory laparotomy, jejunostomy, pyloroplasty, right  thoracotomy, esophagogastrectomy with Ivor-Lewis reconstruction.   SURGEON:  Ines Bloomer, M.D.   FIRST ASSISTANT:  Claudette TNils Flack, N.P.   INDICATIONS FOR PROCEDURE:  The patient had had a dilatation and  biopsy  which resulted in a possible perforation by swallow, but appeared to be  walled off. His white count and temperature came back to normal with  antibiotics, but because of the possible contained perforation, it was  decided to explore the patient for esophagogastrectomy. The risks of the  procedure were explained to the patient and his wife.   DESCRIPTION OF PROCEDURE:  After percutaneous insertion of all monitoring  lines, the patient underwent general anesthesia and was put in the supine  position. After he was prepped and draped in the usual sterile manner an  incision was made and was carried down with the electrocautery to the  subcutaneous tissue and fascia. The abdomen  was entered and exploration was  carried out. The cancer could be felt on the distal esophagus. There were no  other lesions which were felt.   The Kocher maneuver was performed, dissecting up the duodenum off the  inferior vena cava and dissecting over to the pancreas. There was 1 node in  the foramen of Winslow and this was  dissected out for pathologic  examination. Then the lesser omentum was divided off the liver and then  dissected up to the right crura, dissecting the esophagus and the esophago  phrenic ligament off with electrocautery.   Attention was then turned to the greater curvature. There was a fusion of  the omentum between and there was just a very small area of the lesser sac,  and with much care I dissected the mesentery of the transverse colon off the  stomach and  also divided the omentum between clamps. This was done from the  pylorus up to the greater curvature, but at the top of the greater  curvature, there was a lot of fatty tissue, and this was divided with the  harmonic scalpel, dividing the short gastrics off the spleen. Several of the  short gastrics were clipped with ligaclips.   After the greater curvature had been freed up, the lateral portion of  the  crura was freed up, and then dissection went posteriorly down to the right  gastric vessels and the coronary vein, and this was stapled and divided with  the ATW 45 gastric stapler. After they had been divided, the stomach was  freed up and then dissection was started in the hiatus, dissecting up the  tumor, but as we dissected up the hiatus, it was found that there was a  perforation of the tumor on the right side with a marked amount of  inflammatory reaction in the distal  mediastinum. It was too dangerous to  try to do any type of transhiatal esophagectomy.   We then decided to an esophagogastrectomy or right thoracotomy. The pylorus  was divided with electrocautery and then a __________  reconstruction was  done using first a jambe stitch to close the longitudinal opening across the  pylorus transversely, and then interrupted silk stitches  to do a 1 layer  closure. Some other small stitches were placed to reinforce the closure and  then some omentum was placed over the suture line.   The jejunum was identified 30 cm distal  to the ligament of Treitz and a  horizontal mattress suture was placed and a red rubber Robinson catheter was  placed, #14 [place] and brought down to the jejunum for approximately 14 to  18 cm. The horizontal mattress were tied and then a Dalbert Batman jejunostomy was  done, imbricating the jejunum into the antemesenteric border of the jejunum  with interrupted 3-0 silk stitches.   After this had been done up over about 2 cm, the tube  was brought out  through the abdominal  wall and sutured to the abdominal wall with 2-0 silk  and the jejunum was packed to the undersurface of the abdominal wall of  the  peritoneum with 2-0 silk. The abdomen was then closed with running #1 PDS  and an interrupted #1 Vicryl and Ethicon clips for the skin.   The patient was then turned to the right lateral thoracotomy position and  was prepped and draped in the usual sterile manner. A posterolateral dual-  lumen tube was inserted and a posterolateral thoracotomy was done and the  latissimus was divided and the serratus was reflected anteriorly. The 6th  intercostal space was entered and a portion of the 7th rib was taken  posteriorly at the angle. A Finochetti retractor was inserted were  there  was a large amount of inflammatory reaction of the distal  esophagus and the  mediastinum, and the esophagus was first encircled just below the azygous  vein and looped with a Penrose drain.   It was then dissected down to the GE junction, dividing a large amount of  inflammatory mediastinal tissues with electrocautery. After being completely  freed up to the GE junction, the stomach was then brought into the chest,  with the greater curvature being medially and the lesser curvature being  laterally. The the azygous vein was divided with the ATW stapler and  dissection was carried up superiorly up to the apex of the lung, dissecting out the esophagus approximately 2 to 3 cm above the azygous vein.   The NG tube was  pulled back and the esophagus was divided there, and then  the distal  stomach  was divided from the cardia across to the lesser  curvature with 2 applications of the TLC 75 stapler. The specimen was  removed. Frozen section of proximal and distal margins were negative. The  patient  had a large amount of subcarinal nodes and these were dissected out  for pathologic examination. Frozen section of one showed some atypical  cells. All other nodes were dissected out. Several at the lesser curvature,  some of the celiac nodes were also dissected out.   After this had all been done and all nodes were removed. The stomach was  brought up through the chest. The lesser curvature staple line was  imbricated with interrupted 3-0 silk, and an opening was made in the stomach  away from the lesser curvature toward the greater curvature, and the  esophagus staple line was removed and the esophagus was stapled to the  stomach with an ATB 45 during the posterior  anastomosis. The NG tube was  then placed down into the stomach. The anterior anastomosis was done with  interrupted 3-0 silk.   After this had been done some omentum was placed over the anastomosis and  sutured in place with 3-0 silk. The stomach was then tacked to the  mediastinum with 3-0 silk and to the diaphragm with 3-0 silk. A right-angle  chest tube was placed through a separate stab wound guiding the distal  mediastinum, where the perforation occurred, and then 2 straight 36 chest  tubes were placed through separate stab wounds up around the anastomosis.  The area was irrigated copiously and the chest was closed with #2 chromics  and #1 Vicryl in the muscle layer, 2-0 Vicryl in the subcutaneous tissue and  Ethicon skin clips. The patient was returned to the intensive care unit in  serious condition.                                               Ines Bloomer, M.D.    DPB/MEDQ  D:  03/29/2003  T:  03/29/2003  Job:  [id number]   cc:    Tasia Catchings, M.D.  [address]  [place]  [place] [address]  Fax: [phone]   Lorne Skeens. Hoxworth, M.D.  [address]  [place]  [place] [address]  Fax: [phone]   Mikey Bussing, M.D.  [address]  [place]  [place] [address]  Fax: [phone]

## 2011-03-09 NOTE — Discharge Summary (Signed)
[name], [name]                             ACCOUNT NO.:  [id number]   MEDICAL RECORD NO.:  [id number]                   PATIENT TYPE:  INP   LOCATION:  2040                                 FACILITY:  [facility]   PHYSICIAN:  Ines Bloomer, M.D.              DATE OF BIRTH:  [birth year]   DATE OF ADMISSION:  03/23/2003  DATE OF DISCHARGE:  04/09/2003                                 DISCHARGE SUMMARY   PRIMARY CARE PHYSICIAN:  Tasia Catchings, M.D.   GENERAL SURGERY:  Lorne Skeens. Hoxworth, M.D.   ONCOLOGYRose Phi. Myna Hidalgo, M.D.   DATE OF ADMISSION:  March 23, 2003.   DATE OF DISCHARGE:  April 09, 2003.   FINAL DIAGNOSES:  1. Stage III adenocarcinoma of the esophagus, T3, N1, M0.  2. Barrett's esophagus.  3. Gastroesophageal reflux disease.   PROCEDURES:  1. Gastrografin swallow on March 24, 2003.  2. Repeat Gastrografin swallow on March 26, 2003.  3. Esophageal gastrectomy via abdominal and right thoracotomy approach with     pyloroplasty and insertion of jejunostomy tube on March 29, 2003.   BRIEF HISTORY:  The patient was a 45 year old white male with a history of  chronic GERD.  He had no other medical history.  He had had problems with  esophageal stricture and had dilatations done prior to admission.  On March 23, 2003, he had an esophageal dilatation done at [facility].  He was  discharged home and later woke up with a vague right upper quadrant pain and  pleuritic cough.  He was admitted with a temperature of 102.2.  General  surgery was consulted.  A Gastrografin swallow showed a tear in his  esophagus.  Biopsies that had been done previously returned, results on March 24, 2003 of Barrett's esophagus and adenocarcinoma.  A CT of the chest and  abdomen showed air in the mediastinum adjacent to the esophagus from the  carotid to the EGJ.  CVTS was consulted and Dr. Edwyna Shell recommended  esophagogastrectomy.  This was done on March 29, 2003.  There were no  complications.  He had  an epidural for pain control.  Postoperatively, he  was continued on Unasyn and IV antibiotics.  A swallow study was done which  was satisfactory on April 02, 2003.  His white count remained somewhat  elevated during his stay in the 10-15 range.  He steadily improved and was  started on a diet.  He was tolerating soft foods well on April 08, 2003.  On  April 09, 2003, postoperative day 11, he felt well with no complaints.  A  recent swallow showed no extravasation of contrast.  There was a normal  appearance of the esophagogastrectomy reconstruction.  He was tolerating the  J-tube feeds well.  His wound was healing well.  He was up walking around.  He was afebrile and vital signs were  stable.  Lab work was stable.  He was  discharged home in stable condition.  His bowels were working well at the  time of discharge.   MEDICATIONS AT THE TIME OF DISCHARGE:  1. Protonix 40 mg, 1 pill p.o. b.i.d.  2. Tylox 1-2 every 4-6 hours p.r.n. pain.   NO KNOWN DRUG ALLERGIES.   SPECIAL INSTRUCTIONS:  He was told to avoid driving, working, heavy lifting  or strenuous activity.  He was told that he could shower and to clean his  wound gently daily with soap and water.  He was told to get a chest x-ray  and CBC one hour before seeing Dr. Edwyna Shell and to bring it with him to see  Dr. Edwyna Shell.   CONDITION:  Stable.   DISPOSITION:  Home.   FOLLOW UP:  Dr. Edwyna Shell on Tuesday, April 20, 2003 at 3:20 p.m.     Lissa Merlin, P.A.                          Ines Bloomer, M.D.    Alwyn Ren  D:  04/22/2003  T:  04/23/2003  Job:  [id number]   cc:   Sharlet Salina T. Hoxworth, M.D.  [address]  [place]  [place] [address]  Fax: [phone]   Rose Phi. Myna Hidalgo, M.D.  [address] [facility]  [place], [place] [address]  Fax: [phone]   Tasia Catchings, M.D.  [address]  [place]  [place] [address]  Fax: [phone]   Llana Aliment. Malon Kindle., M.D.  [address]  [place]  [place] [address]   Fax: [phone]    cc:   Lorne Skeens. Hoxworth, M.D.  [address], [address]  [place]  [place] [address]  Fax: [phone]   Rose Phi. Myna Hidalgo, M.D.  [address] [facility]  [place], [place] [address]  Fax: [phone]   Tasia Catchings, M.D.  [address]  [place]  [place] [address]  Fax: [phone]   Llana Aliment. Malon Kindle., M.D.  [address]  [place]  [place] [address]  Fax: [phone]

## 2011-03-09 NOTE — Consult Note (Signed)
NAMEJOSEANTONIO, [name]                             ACCOUNT NO.:  [id number]   MEDICAL RECORD NO.:  [id number]                   PATIENT TYPE:  INP   LOCATION:  5033                                 FACILITY:  [facility]   PHYSICIAN:  Sharlet Salina T. Skinner, M.D.          DATE OF BIRTH:  [birth year]   DATE OF CONSULTATION:  03/24/2003  DATE OF DISCHARGE:                                   CONSULTATION   CHIEF COMPLAINT:  Chest pain following esophageal dilatation.   HISTORY OF PRESENT ILLNESS:  I was asked by [name], M.D., to  evaluate the patient.  He is a very pleasant 45 year old white male with a  history of esophageal stricture felt secondary to gastroesophageal reflux  disease.  He underwent dilatation on 03/23/03 at about 7:30 a.m. to [address] [place]  without apparent problem.  The patient went home and went to sleep and awoke  two to three hours later with low right anterior chest pain.  This was not  severe.  He ate and drank small amounts throughout the day and then noticed  fever this evening and presents to the emergency room.  He continues to have  moderate right anterior chest pain.  No shortness of breath.  Denies  abdominal pain.  No nausea or vomiting.  At the time of this dictation he is  approximately 20 hours after his procedure.   PAST MEDICAL HISTORY:  He denies any serious medical or surgical illness  except as above.   MEDICATIONS:  His only medication is Aciphex.   ALLERGIES:  No known allergies.   SOCIAL HISTORY:  He is married.  He does not smoke cigarettes.  Drinks  moderate alcohol.   FAMILY HISTORY:  Noncontributory.   REVIEW OF SYSTEMS:  Very healthy, entirely negative except as above.   PHYSICAL EXAMINATION:  VITAL SIGNS:  Temperature was 102.2 on admission and  now 99, pulse was 94 on admission and now 76, blood pressure 122/59,  respirations 18.  GENERAL:  He is a mildly overweight white male, ambulatory and in no acute  distress.  SKIN:  Warm and  dry.  HEENT:  No crepitus in the neck.  No masses.  CHEST:  Breath sounds clear and equal bilaterally.  CARDIAC:  Regular rate and rhythm without murmurs.  ABDOMEN:  Soft without any tenderness.  No masses or organomegaly  appreciated.  EXTREMITIES:  No edema.   LABORATORY DATA:  Admission laboratory significant for a white count of 15.9  thousand.  Hemoglobin 14.1.  Electrolytes, BUN and creatinine, LFTs all  within normal limits.   CT scan of the chest and abdomen with contrast was obtained from the  emergency room.  This reveals air adjacent to the esophagus in the chest  extending from the carina to just above the EG junction.  There is no fluid,  no pleural effusion, and no apparent contrast extravasation.  Following this study, a Gastrografin swallow was obtained.  I observed this  and reviewed the results with [name], M.D.  There is a mucosal  defect several centimeters above the EG junction.  This, however, appears  contained within the esophageal wall with no extravasation into the  mediastinum and no pooling.  This clears with swallowing.   While under fluoroscopy, an NG tube was passed into the proximal stomach  under direct vision.   ASSESSMENT AND PLAN:  Esophageal injury at the time of dilatation and at the  time of this dictation about 20 hours following injury.  Although the  patient was febrile with elevated white count on arrival, his temperature is  down, vital signs remain stable, and he does not appear ill.  A Gastrografin  swallow indicates a contained injury.  At this point I would favor careful  nonoperative management with NG suction, broad-spectrum antibiotics, and IV  fluids.  If an operative approach is required, this will likely best be done  through a thoracotomy.  I therefore discussed this with [name],  M.D., with [facility], who agrees with the current plan and will follow up shortly.                                                [name], M.D.    Tory Emerald  D:  03/24/2003  T:  03/24/2003  Job:  [id number]

## 2011-03-09 NOTE — Op Note (Signed)
[name], [name]                             ACCOUNT NO.:  [id number]   MEDICAL RECORD NO.:  [id number]                   PATIENT TYPE:  OIB   LOCATION:  2899                                 FACILITY:  [facility]   PHYSICIAN:  Ines Bloomer, M.D.              DATE OF BIRTH:  [birth year]   DATE OF PROCEDURE:  05/10/2003  DATE OF DISCHARGE:                                 OPERATIVE REPORT   PREOPERATIVE DIAGNOSIS:  Malfunction, left subclavian Port-A-Cath, status  post esophagectomy for esophageal cancer.   POSTOPERATIVE DIAGNOSIS:  Malfunction, left subclavian Port-A-Cath, status  post esophagectomy for esophageal cancer.   OPERATION PERFORMED:  Revision of left subclavian Port-A-Cath.   SURGEON:  Ines Bloomer, M.D.   ANESTHESIA:  1% Xylocaine.   DESCRIPTION OF PROCEDURE:  After prepping and draping the left chest, the  area was instilled with 1% Xylocaine at the previous Port-A-Cath insertion.  This Port-A-Cath would irrigate and withdraw but it was somewhat hard,  particularly with position, so it was felt that he was kinking the tubing as  it went between the clavicle and the first rib.  It was decided to put this  into the left IJ to avoid this.  The area was infiltrated with 1% Xylocaine  over in the left neck and left internal jugular puncture was performed and a  guidewire threaded under fluoro guidance to the right atrium.  A stab wound  was made around the guidewire.  Then the previous Port-A-Cath incision was  opened and the old Port-A-Cath was removed.  It was noted that when we  pulled the tubing out, it was somewhat hard to pull out and this was  probably secondary to the narrowing between the left clavicle and the left  first rib.  Over the guidewire was passed a dilator with peel-away sheath.  The guidewire and the dilator were removed and through the peel-away sheath  was passed a Port-A-Cath tube and confirmed to be in the right atrium under  fluoroscopy  guidance.  Using a tunneler, it was tunneled retrograde down to  the pocket and then attached to the new Port-A-Cath with the locking device.  The Port-A-Cath was placed in the wound and sutured in place with 3-0 silk  in the suture port and then the wounds were closed with 3-0 Vicryl in the  subcutaneous tissue and Dermabond for the skin.  It flushed easily and  withdrew easily and was left cannulated.  A dry sterile dressing was  applied.  The patient was then transferred to the recovery room in stable  condition.                                                Ines Bloomer, M.D.  DPB/MEDQ  D:  05/10/2003  T:  05/10/2003  Job:  [id number]   cc:   Rose Phi. Myna Hidalgo, M.D.  [address] [facility]  [place], [place] [address]  Fax: [phone]

## 2011-03-09 NOTE — Consult Note (Signed)
NAMECOLETON, WOON                             ACCOUNT NO.:  [id number]   MEDICAL RECORD NO.:  [id number]                   PATIENT TYPE:  INP   LOCATION:  3312                                 FACILITY:  [facility]   PHYSICIAN:  Rose Phi. Myna Hidalgo, M.D.              DATE OF BIRTH:  Apr 25, 1966   DATE OF CONSULTATION:  04/02/2003  DATE OF DISCHARGE:                                   CONSULTATION   REFERRING PHYSICIAN:  Ines Bloomer, M.D.   REASON FOR CONSULTATION:  Stage III (T3N1M0) adenocarcinoma of the distal  esophagus.   HISTORY OF PRESENT ILLNESS:  Mr. 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  He is originally from  [place].  He has been done in [place] for about a year or so.   He does have a history of gastroesophageal reflux.  He has been on Aciphex.  He has been followed by Dr. Randa Evens.   He was having difficulties with swallowing.  He was having a lot of  dysphagia.  There was not much in the way of odynophagia.   He saw Dr. Randa Evens.  He subsequently underwent a dilation on March 23, 2003.  The patient subsequently developed chest pain about five or six hours after  the procedure.  He then began to have some problems with fever.  He went to  the emergency room.  He subsequently underwent a CT scan.  CT showed air  adjacent to the esophagus in the chest.  There was no fluid.  No pleural  effusion.  No contrast extravasation.   He subsequently was seen by Dr. Johna Sheriff.  He did not feel that this was a  surgical problem.  However, it was felt that if a procedure needed to be  done, that thoracic surgery needed to be involved.   The patient was followed in the hospital.  He was placed TNA and had NG tube  placed.   He had a repeat Gastrografin swallow on March 26, 2003.  Again this showed a  continued a leak in the distal esophagus.   Dr. Edwyna Shell then felt that surgical intervention was needed.  He took the  patient to  surgery.  An esophagectomy was performed.  Surprisingly enough,  the esophagectomy results showed adenocarcinoma of the esophagus and the  proximal stomach.  The pathology report ([id number]) showed two separate  foci of tumor.  One foci measured 4.6 cm and the other measured 0.7 cm.  Eleven lymph nodes were examined.  Only one contained malignancy.  There was  no extracaliceal extension.  It was felt that this was an esophagogastric  junction tumor.  This was arising out of Barrett's esophagus.   Mr. Tanney subsequently was found to have stage III (T3N1M0) adenocarcinoma of  the EG junction.  He did have vascular and lymphatic invasion.  There was a  radial margin that was close but negative.  All the proximal and distal  margins were negative.   Postoperative, he has done well.  He has had a feeding jejunostomy tube in  place.  He was able to swallow some.   PAST MEDICAL HISTORY:  His past medical history is pretty much unremarkable.  He has the GERD with subsequent Barrett's esophagus.   ALLERGIES:  None.   ADMISSION MEDICATIONS:  Aciphex, I think 20 mg p.o. q.d.   SOCIAL HISTORY:  Negative for tobacco use.  There is social alcohol use.  He  is a Teacher, early years/pre.  He has I think two kids.   FAMILY HISTORY:  Unremarkable.   REVIEW OF SYMPTOMS:  Is as stated in history of present illness.  No  additional findings are noted.   PHYSICAL EXAMINATION:  GENERAL:  This is a well-developed, well-nourished,  white gentleman in no obvious distress.  VITAL SIGNS:  Temperature 99, pulse 100, respiratory rate 16, blood pressure  128/66.  HEAD/NECK:  Shows a normocephalic, atraumatic skull.  He does have IV lines  coming out the neck.  There is no adenopathy in the neck.  LUNGS:  Show decreased breath sounds bilaterally.  CARDIAC:  Tachycardic but regular.  There are no murmurs, rubs or bruits.  ABDOMEN:  Soft with decreased bowel sounds.  Jejunostomy tube in place.  He  has no ascites.  There  is no obvious hepatosplenomegaly.  BACK:  Showed chest tubes on the right side.  He has no chest tubes on the  left chest wall.  EXTREMITIES:  Shows no clubbing, cyanosis or edema.  NEUROLOGIC:  No focal neurological deficits.   LABORATORY DATA:  White cell count of 10.1, hemoglobin 14.8, hematocrit  42.5, platelet count 364.  Sodium 136, potassium 3.7, BUN 9, creatinine 1.3,  calcium 9.  LFTs are within normal limits.   IMPRESSION:  Mr. Millhouse is a 45 year old gentleman with adenocarcinoma of the  gastroesophageal junction.  This arose out of Barrett's esophagus.  This is  a remarkable finding for malignancy.   He does not have any obvious metastatic disease.   He certainly is at high risk for recurrence.  I believe that there would be  a role for adjuvant therapy for Mr. Rowlette.  The studies are certainly  controversial regarding adjuvant therapy but I believe that Mr. Filley would  benefit.   For now, we just need to let him recover.  We need to let him go home, get  some strength, be able to eat better.   I spoke with the patient's wife at length.  They understand the situation.  They understand the role of chemotherapy.   I am not sure about a role for radiation therapy.  A close radial margin is  unclear in its significance for recurrence.  I will speak with radiation  oncology regarding this fact.   I would probably use a combination of Taxotere/Cisplatin/infusional 5-FU  which has been shown to have excellent activity with gastroesophageal  junction malignancies.   The patient likely will need to have a Port-A-Cath placed at some point.  This can be done close to the time of chemotherapy.   Will plan for adjuvant chemotherapy to start probably in about three weeks  or so after he gets home.  Rose Phi. Myna Hidalgo, M.D.   PRE/MEDQ  D:  04/05/2003  T:  04/05/2003  Job:  [id number]   cc:   Ines Bloomer, M.D.  [address]  [place]  [place] [address]  Fax: [phone]   Tasia Catchings, M.D.  [address]  [place]  [place] [address]  Fax: [phone]

## 2011-07-19 LAB — COMPREHENSIVE METABOLIC PANEL
ALT: 25
AST: 19
Albumin: 4.1
CO2: 28
Calcium: 9.7
Creatinine, Ser: 1.28
GFR calc Af Amer: >60
GFR calc non Af Amer: >60
Sodium: 136
Total Protein: 7.2

## 2011-07-19 LAB — CBC
MCHC: 33.8
MCV: 81.8
Platelets: 315
RBC: 5.57

## 2011-07-19 LAB — APTT: aPTT: 31

## 2011-08-14 ENCOUNTER — Encounter: Payer: Self-pay | Admitting: *Deleted

## 2011-09-14 ENCOUNTER — Telehealth: Payer: Self-pay | Admitting: *Deleted

## 2011-09-14 ENCOUNTER — Other Ambulatory Visit: Payer: Self-pay | Admitting: Hematology & Oncology

## 2011-09-14 ENCOUNTER — Ambulatory Visit (HOSPITAL_BASED_OUTPATIENT_CLINIC_OR_DEPARTMENT_OTHER): Payer: BC Managed Care – PPO | Admitting: Hematology & Oncology

## 2011-09-14 ENCOUNTER — Other Ambulatory Visit (HOSPITAL_BASED_OUTPATIENT_CLINIC_OR_DEPARTMENT_OTHER): Payer: BC Managed Care – PPO | Admitting: Lab

## 2011-09-14 VITALS — BP 167/102 | HR 81 | Temp 96.6°F | Ht 70.0 in | Wt 231.0 lb

## 2011-09-14 DIAGNOSIS — C155 Malignant neoplasm of lower third of esophagus: Secondary | ICD-10-CM

## 2011-09-14 DIAGNOSIS — Z8501 Personal history of malignant neoplasm of esophagus: Secondary | ICD-10-CM

## 2011-09-14 DIAGNOSIS — C159 Malignant neoplasm of esophagus, unspecified: Secondary | ICD-10-CM

## 2011-09-14 LAB — CBC WITH DIFFERENTIAL (CANCER CENTER ONLY)
BASO%: 0.4 % (ref 0.0–2.0)
Eosinophils Absolute: 0.5 10*3/uL (ref 0.0–0.5)
LYMPH%: 21.1 % (ref 14.0–48.0)
MCH: 28.5 pg (ref 28.0–33.4)
MCV: 82 fL (ref 82–98)
MONO%: 8.2 % (ref 0.0–13.0)
NEUT#: 5.5 10*3/uL (ref 1.5–6.5)
Platelets: 296 10*3/uL (ref 145–400)
RBC: 5.5 10*6/uL (ref 4.20–5.70)
RDW: 13.2 % (ref 11.1–15.7)
WBC: 8.5 10*3/uL (ref 4.0–10.0)

## 2011-09-14 NOTE — Progress Notes (Signed)
This office note has been dictated. CSN: [id number]

## 2011-09-14 NOTE — Progress Notes (Signed)
CC:   Tammy R. Collins Scotland, M.D.  DIAGNOSES:  Stage III (T3 N1 M0) adenocarcinoma of the distal esophagus, in remission.  CURRENT THERAPY:  Observation.  INTERIM HISTORY:  [name] comes in for his followup.  We see him every 6 months.  He is doing okay.  His blood pressure is still on the high side.  He sees Dr. Herb Grays for his general medical care.  He is still doing well at [facility].  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  He had a good summer.  He has had no problems health wise that are new.  His appetite is good.  He has had no problems with dysphagia or odynophagia.  There has been no abdominal pain.  He has had no change in bowel or bladder habits.  He has had no leg swelling.  There have been no rashes.  PHYSICAL EXAMINATION:  General:  This is a well-developed, well- nourished white gentleman in no obvious distress.  Vital signs:  96.6, pulse 81, respiratory rate 16, blood pressure 167/102.  Weight is 231. Head and neck:  Exam shows a normocephalic, atraumatic skull.  There are no ocular or oral lesions.  Lymphs:  There are no palpable cervical or supraclavicular lymph nodes.  Lungs:  Clear bilaterally.  Cardiac: Regular rate and rhythm with normal S1, S2.  There are no murmurs, rubs or bruits.  Abdomen:  Soft with good bowel sounds.  There is no palpable abdominal mass.  There is no fluid wave.  There is a well-healed laparotomy wound.  Extremities:  Shows no clubbing, cyanosis or edema. Neurological:  Shows no focal neurological deficits.  LABORATORY STUDIES:  White cell count is 8.5, hemoglobin 15.7, hematocrit 45, platelet count 296.  IMPRESSION:  [name] is a 45 year old gentleman with a history of stage III adenocarcinoma of the esophagus.  He underwent resection.  He did undergo adjuvant therapy with chemo and radiation therapy.  He now is out from his treatment by almost 7 years.  I do not see any need for scans right now.  Clinically he  is asymptomatic.  Will continue to follow him every 6 months.  I will plan to get him back in May.  As always, I tried to counsel him about his blood pressure and weight. He is trying to make a concerted effort regarding this.   ______________________________ Josph Macho, M.D. PRE/MEDQ  D:  09/14/2011  T:  09/14/2011  Job:  [id number]

## 2011-09-14 NOTE — Telephone Encounter (Signed)
Mailed schedule to Pt

## 2011-09-15 LAB — VITAMIN D 25 HYDROXY (VIT D DEFICIENCY, FRACTURES): Vit D, 25-Hydroxy: 62 ng/mL (ref 30–89)

## 2011-09-15 LAB — COMPREHENSIVE METABOLIC PANEL
ALT: 25 U/L (ref 0–53)
Albumin: 4.2 g/dL (ref 3.5–5.2)
Alkaline Phosphatase: 78 U/L (ref 39–117)
CO2: 27 mEq/L (ref 19–32)
Glucose, Bld: 104 mg/dL — ABNORMAL HIGH (ref 70–99)
Potassium: 4.3 mEq/L (ref 3.5–5.3)
Sodium: 141 mEq/L (ref 135–145)
Total Bilirubin: 0.5 mg/dL (ref 0.3–1.2)
Total Protein: 7.2 g/dL (ref 6.0–8.3)

## 2011-09-21 ENCOUNTER — Other Ambulatory Visit: Payer: BC Managed Care – PPO | Admitting: Lab

## 2011-09-21 ENCOUNTER — Ambulatory Visit: Payer: BC Managed Care – PPO | Admitting: Hematology & Oncology

## 2012-03-14 ENCOUNTER — Ambulatory Visit: Payer: BC Managed Care – PPO | Admitting: Hematology & Oncology

## 2012-03-14 ENCOUNTER — Ambulatory Visit (HOSPITAL_BASED_OUTPATIENT_CLINIC_OR_DEPARTMENT_OTHER): Payer: BC Managed Care – PPO | Admitting: Hematology & Oncology

## 2012-03-14 ENCOUNTER — Other Ambulatory Visit: Payer: BC Managed Care – PPO | Admitting: Lab

## 2012-03-14 ENCOUNTER — Other Ambulatory Visit (HOSPITAL_BASED_OUTPATIENT_CLINIC_OR_DEPARTMENT_OTHER): Payer: BC Managed Care – PPO | Admitting: Lab

## 2012-03-14 VITALS — HR 92 | Temp 97.6°F | Ht 70.0 in | Wt 237.5 lb

## 2012-03-14 DIAGNOSIS — Z85038 Personal history of other malignant neoplasm of large intestine: Secondary | ICD-10-CM

## 2012-03-14 DIAGNOSIS — C159 Malignant neoplasm of esophagus, unspecified: Secondary | ICD-10-CM

## 2012-03-14 DIAGNOSIS — Z8501 Personal history of malignant neoplasm of esophagus: Secondary | ICD-10-CM

## 2012-03-14 LAB — LACTATE DEHYDROGENASE: LDH: 123 U/L (ref 94–250)

## 2012-03-14 LAB — COMPREHENSIVE METABOLIC PANEL
ALT: 18 U/L (ref 0–53)
Alkaline Phosphatase: 70 U/L (ref 39–117)
CO2: 29 mEq/L (ref 19–32)
Potassium: 4 mEq/L (ref 3.5–5.3)
Sodium: 140 mEq/L (ref 135–145)
Total Bilirubin: 0.4 mg/dL (ref 0.3–1.2)
Total Protein: 6.8 g/dL (ref 6.0–8.3)

## 2012-03-14 LAB — CBC WITH DIFFERENTIAL (CANCER CENTER ONLY)
BASO#: 0 10*3/uL (ref 0.0–0.2)
BASO%: 0.4 % (ref 0.0–2.0)
EOS%: 7.7 % — ABNORMAL HIGH (ref 0.0–7.0)
HGB: 15.4 g/dL (ref 13.0–17.1)
LYMPH#: 1.6 10*3/uL (ref 0.9–3.3)
MCHC: 35 g/dL (ref 32.0–35.9)
MONO#: 0.6 10*3/uL (ref 0.1–0.9)
NEUT#: 4.3 10*3/uL (ref 1.5–6.5)
WBC: 7 10*3/uL (ref 4.0–10.0)

## 2012-03-14 NOTE — Progress Notes (Signed)
This office note has been dictated.

## 2012-03-15 NOTE — Progress Notes (Signed)
CC:   Tammy R. Collins Scotland, M.D.  DIAGNOSIS:  Stage III (T3 N1 M0) adenocarcinoma of the distal esophagus, remission.  CURRENT THERAPY:  Observation.  INTERIM HISTORY:  Kendle comes in for follow up.  We see him every 6 months.  He is doing okay.  He is a Teacher, early years/pre at [employer].  This is really starting to eat at him.  He says that things are just getting different.  He is really having a tough time dealing with all the issues at work.  However, he is hanging in there.  He is having no problems swallowing. He has had no problems with cough. He has had no abdominal pain.  There has been no change in bowel or bladder habits.  There has been no bony pain.  He sees Dr. Herb Grays. He is on blood pressure medication now.  He takes Hyzaar.  PHYSICAL EXAMINATION:  This is a well-developed, well-nourished white gentleman in no obvious distress.  Vital signs:  97.6, pulse 92, respiratory rate 18, blood pressure 123/74.  Weight is 237.  Head and neck:  Normocephalic, atraumatic skull.  There are no ocular or oral lesions.  There are no palpable cervical or supraclavicular lymph nodes. Lungs:  Clear bilaterally.  Cardiac:  Regular rate and rhythm with a normal S1 and S2.  There are no murmurs, rubs or bruits.  Abdomen:  soft with good bowel sounds.  There is no palpable abdominal mass.  There is no fluid wave.  There is no palpable hepatosplenomegaly.  He does have a well-healed esophagectomy scar down the midline.  Back: No tenderness of the spine, ribs, or hips.  Extremities:  No clubbing, cyanosis or edema. Neurologic:  No focal neurological deficits.  LABORATORY STUDIES:  White cell count is 7, hemoglobin is 15.4, hematocrit 44, platelet count 311.  Electrolytes show a BUN of 19, creatinine 1.29.  Calcium is 9.2.  LFTs are normal.  IMPRESSION:  Mr. Donaghey is a 46 year old gentleman with a history of stage III adenocarcinoma of the distal esophagus.  He did undergo adjuvant chemo and radiation  therapy.  He is over 7 years out from treatment completion.  He is cured in my mind.  I really think we can get him back yearly now.  He is being followed by Dr. Collins Scotland.  Blood pressure is much better.    ______________________________ Josph Macho, M.D. PRE/MEDQ  D:  03/14/2012  T:  03/15/2012  Job:  [id number]

## 2012-03-18 ENCOUNTER — Telehealth: Payer: Self-pay | Admitting: Hematology & Oncology

## 2012-03-18 NOTE — Telephone Encounter (Signed)
Mailed 02-2013 schedule

## 2012-03-20 ENCOUNTER — Telehealth: Payer: Self-pay | Admitting: *Deleted

## 2012-03-20 NOTE — Telephone Encounter (Signed)
Called patient to let him know that his labs were ok per dr. ennever. 

## 2012-03-20 NOTE — Telephone Encounter (Signed)
Message copied by Anselm Jungling on Thu Mar 20, 2012 11:03 AM ------      Message from: Arlan Organ R      Created: Wed Mar 19, 2012 10:12 PM       Call - labs are ok!!  pete

## 2013-01-20 ENCOUNTER — Telehealth: Payer: Self-pay | Admitting: Hematology & Oncology

## 2013-01-20 NOTE — Telephone Encounter (Signed)
Pt moved 5-28 to 6-4

## 2013-03-18 ENCOUNTER — Other Ambulatory Visit: Payer: BC Managed Care – PPO | Admitting: Lab

## 2013-03-18 ENCOUNTER — Ambulatory Visit: Payer: BC Managed Care – PPO | Admitting: Hematology & Oncology

## 2013-03-25 ENCOUNTER — Ambulatory Visit (HOSPITAL_BASED_OUTPATIENT_CLINIC_OR_DEPARTMENT_OTHER): Payer: BC Managed Care – PPO | Admitting: Hematology & Oncology

## 2013-03-25 ENCOUNTER — Other Ambulatory Visit (HOSPITAL_BASED_OUTPATIENT_CLINIC_OR_DEPARTMENT_OTHER): Payer: BC Managed Care – PPO | Admitting: Lab

## 2013-03-25 ENCOUNTER — Telehealth: Payer: Self-pay | Admitting: Hematology & Oncology

## 2013-03-25 VITALS — BP 151/89 | HR 83 | Temp 98.1°F | Resp 18 | Ht 70.0 in | Wt 234.0 lb

## 2013-03-25 DIAGNOSIS — C159 Malignant neoplasm of esophagus, unspecified: Secondary | ICD-10-CM

## 2013-03-25 DIAGNOSIS — Z8501 Personal history of malignant neoplasm of esophagus: Secondary | ICD-10-CM

## 2013-03-25 LAB — COMPREHENSIVE METABOLIC PANEL
ALT: 13 U/L (ref 0–53)
AST: 13 U/L (ref 0–37)
CO2: 28 mEq/L (ref 19–32)
Creatinine, Ser: 1.29 mg/dL (ref 0.50–1.35)
Sodium: 140 mEq/L (ref 135–145)
Total Bilirubin: 0.4 mg/dL (ref 0.3–1.2)
Total Protein: 7.2 g/dL (ref 6.0–8.3)

## 2013-03-25 LAB — CBC WITH DIFFERENTIAL (CANCER CENTER ONLY)
BASO%: 0.5 % (ref 0.0–2.0)
EOS%: 6.6 % (ref 0.0–7.0)
LYMPH#: 1.6 10*3/uL (ref 0.9–3.3)
LYMPH%: 19.3 % (ref 14.0–48.0)
MCHC: 33.7 g/dL (ref 32.0–35.9)
MCV: 82 fL (ref 82–98)
MONO#: 0.7 10*3/uL (ref 0.1–0.9)
Platelets: 387 10*3/uL (ref 145–400)
RDW: 13.2 % (ref 11.1–15.7)
WBC: 8.4 10*3/uL (ref 4.0–10.0)

## 2013-03-25 NOTE — Progress Notes (Signed)
This office note has been dictated.

## 2013-03-25 NOTE — Telephone Encounter (Signed)
Mailed 03-2014 schedule °

## 2013-03-26 NOTE — Progress Notes (Signed)
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  CURRENT THERAPY:  Observation.  INTERIM HISTORY:  Sire comes in for his yearly followup.  He is doing all right.  He now sees Dr. Arlyce Dice at [facility].  She is being pretty aggressive with getting his blood pressure under control.  He has had no problems with headache.  There are no swallowing difficulties.  He has had no fevers, sweats, or chills.  He has had no nausea or vomiting.  He has had no change in bowel or bladder habits. There has been no leg swelling.  He has had no rashes.  Overall, his performance status is ECOG 0.  PHYSICAL EXAMINATION:  General:  This is a well-developed, well- nourished white gentleman in no obvious distress.  Vital signs: Temperature of 98.1, pulse 83, respiratory rate 18, blood pressure 151/89.  Weight is 234.  Head and neck:  Normocephalic, atraumatic skull.  There are no ocular or oral lesions.  There are no palpable cervical or supraclavicular lymph nodes.  Lungs:  Clear bilaterally. Cardiac:  Regular rate and rhythm with a normal S1 and S2.  There are no murmurs, rubs, or bruits.  Abdomen:  Soft with good bowel sounds.  There is no palpable abdominal mass.  He has no fluid wave.  He has no palpable hepatosplenomegaly.  Back:  No tenderness over the spine, ribs, or hips.  Extremities:  No clubbing, cyanosis, or edema.  Neurological: No focal neurological deficits.  LABORATORY STUDIES:  White cell count is 8.4, hemoglobin 14.1, hematocrit 41.8, platelet count 387.  IMPRESSION:  Mr. Rouch is a 47 year old gentleman with history of stage III adenocarcinoma of the distal esophagus.  He underwent resection.  He did have adjuvant chemo and radiation therapy.  He is 8 years out now.  He has done incredibly well.  We will plan to get him back in another year.  I do not see need for any scans that need to be  done.    ______________________________ Josph Macho, M.D. PRE/MEDQ  D:  03/25/2013  T:  03/26/2013  Job:  [id number]

## 2014-03-24 ENCOUNTER — Other Ambulatory Visit (HOSPITAL_BASED_OUTPATIENT_CLINIC_OR_DEPARTMENT_OTHER): Payer: BC Managed Care – PPO | Admitting: Lab

## 2014-03-24 ENCOUNTER — Ambulatory Visit (HOSPITAL_BASED_OUTPATIENT_CLINIC_OR_DEPARTMENT_OTHER): Payer: BC Managed Care – PPO | Admitting: Hematology & Oncology

## 2014-03-24 VITALS — BP 149/84 | HR 77 | Temp 98.1°F | Resp 16 | Wt 228.0 lb

## 2014-03-24 DIAGNOSIS — C159 Malignant neoplasm of esophagus, unspecified: Secondary | ICD-10-CM

## 2014-03-24 DIAGNOSIS — Z853 Personal history of malignant neoplasm of breast: Secondary | ICD-10-CM

## 2014-03-24 DIAGNOSIS — E559 Vitamin D deficiency, unspecified: Secondary | ICD-10-CM

## 2014-03-24 DIAGNOSIS — Z8501 Personal history of malignant neoplasm of esophagus: Secondary | ICD-10-CM

## 2014-03-24 LAB — COMPREHENSIVE METABOLIC PANEL WITH GFR
ALT: 9 U/L (ref 0–53)
AST: 11 U/L (ref 0–37)
Albumin: 4.1 g/dL (ref 3.5–5.2)
Alkaline Phosphatase: 74 U/L (ref 39–117)
BUN: 21 mg/dL (ref 6–23)
CO2: 28 meq/L (ref 19–32)
Calcium: 9.4 mg/dL (ref 8.4–10.5)
Chloride: 101 meq/L (ref 96–112)
Creatinine, Ser: 1.24 mg/dL (ref 0.50–1.35)
Glucose, Bld: 80 mg/dL (ref 70–99)
Potassium: 3.9 meq/L (ref 3.5–5.3)
Sodium: 140 meq/L (ref 135–145)
Total Bilirubin: 0.5 mg/dL (ref 0.2–1.2)
Total Protein: 7.4 g/dL (ref 6.0–8.3)

## 2014-03-24 LAB — CBC WITH DIFFERENTIAL (CANCER CENTER ONLY)
BASO#: 0 10e3/uL (ref 0.0–0.2)
BASO%: 0.2 % (ref 0.0–2.0)
EOS%: 2.2 % (ref 0.0–7.0)
Eosinophils Absolute: 0.2 10e3/uL (ref 0.0–0.5)
HCT: 42.2 % (ref 38.7–49.9)
HGB: 13.9 g/dL (ref 13.0–17.1)
LYMPH#: 1.2 10e3/uL (ref 0.9–3.3)
LYMPH%: 11.7 % — ABNORMAL LOW (ref 14.0–48.0)
MCH: 27.5 pg — ABNORMAL LOW (ref 28.0–33.4)
MCHC: 32.9 g/dL (ref 32.0–35.9)
MCV: 83 fL (ref 82–98)
MONO#: 1 10e3/uL — ABNORMAL HIGH (ref 0.1–0.9)
MONO%: 9.8 % (ref 0.0–13.0)
NEUT#: 7.8 10e3/uL — ABNORMAL HIGH (ref 1.5–6.5)
NEUT%: 76.1 % (ref 40.0–80.0)
Platelets: 395 10e3/uL (ref 145–400)
RBC: 5.06 10e6/uL (ref 4.20–5.70)
RDW: 13.6 % (ref 11.1–15.7)
WBC: 10.3 10e3/uL — ABNORMAL HIGH (ref 4.0–10.0)

## 2014-03-26 NOTE — Progress Notes (Signed)
Hematology and Oncology Follow Up Visit  [name] [id number] 02-Oct-1966 48 y.o. 03/26/2014   Principle Diagnosis:   Stage III (T3N1M0) adenocarcinoma of the esophagus  Current Therapy:    Observation     Interim History:  Mr.  [name] is comes in for his 1 year followup. He is doing quite well. He's working. Is now 9 years since he had his treatment after surgery. He did have chemotherapy and radiation therapy.  There is no dysphasia. He's had no pain. He's had no shortness of breath. There's been no change in bowel or bladder habits. He's had a rashes. He's had no headache.  Medications: Current outpatient prescriptions:aspirin 81 MG tablet, Take 81 mg by mouth daily.  , Disp: , Rfl: ;  cetirizine (ZYRTEC) 10 MG tablet, Take 10 mg by mouth daily.  , Disp: , Rfl: ;  Cholecalciferol (CVS VIT D 5000 HIGH-POTENCY) 5000 UNITS capsule, Take 5,000 Units by mouth daily.  , Disp: , Rfl: ;  losartan-hydrochlorothiazide (HYZAAR) 100-25 MG per tablet, 100 mg Daily., Disp: , Rfl:  Multiple Vitamin (MULTIVITAMIN) tablet, Take 1 tablet by mouth daily.  , Disp: , Rfl: ;  omeprazole (PRILOSEC) 20 MG capsule, Take 20 mg by mouth 2 (two) times daily.  , Disp: , Rfl: ;  pseudoephedrine (SUDAFED) 30 MG tablet, Take 30 mg by mouth as needed for congestion., Disp: , Rfl:   Allergies:  Allergies  Allergen Reactions  . Tetanus Toxoids Other (See Comments)    High fever and fainting, this reaction was as an infant.     Past Medical History, Surgical history, Social history, and Family History were reviewed and updated.  Review of Systems: As above  Physical Exam:  weight is 228 lb (103.42 kg). His oral temperature is 98.1 F (36.7 C). His blood pressure is 149/84 and his pulse is 77. His respiration is 16.   Lungs are clear. Cardiac exam regular in rhythm. Abdomen soft. Has good bowel sounds. There is no fluid wave. There is no palpable liver or spleen. Extremities no clubbing cyanosis or edema. Has good  strength and range of motion of his joints. Skin exam no rashes. Neurological exam is non-focal.  Lab Results  Component Value Date   WBC 10.3* 03/24/2014   HGB 13.9 03/24/2014   HCT 42.2 03/24/2014   MCV 83 03/24/2014   PLT 395 03/24/2014     Chemistry      Component Value Date/Time   NA 140 03/24/2014 0902   NA 141 02/22/2010 0801   K 3.9 03/24/2014 0902   K 4.3 02/22/2010 0801   CL 101 03/24/2014 0902   CL 99 02/22/2010 0801   CO2 28 03/24/2014 0902   CO2 30 02/22/2010 0801   BUN 21 03/24/2014 0902   BUN 14 02/22/2010 0801   CREATININE 1.24 03/24/2014 0902   CREATININE 1.3* 02/22/2010 0801      Component Value Date/Time   CALCIUM 9.4 03/24/2014 0902   CALCIUM 9.4 02/22/2010 0801   ALKPHOS 74 03/24/2014 0902   ALKPHOS 85* 02/22/2010 0801   AST 11 03/24/2014 0902   AST 23 02/22/2010 0801   ALT 9 03/24/2014 0902   ALT 23 02/22/2010 0801   BILITOT 0.5 03/24/2014 0902   BILITOT 0.70 02/22/2010 0801         Impression and Plan: Mr. [name] is 48 year old gentleman with a history of stage III adenocarcinoma of the distal esophagus. He underwent a resection. He had positive margins and we gave him  adjuvant chemotherapy and radiation therapy.  As always, it is a lot fun talking to him. 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  We will see him back in one more year.   Volanda Napoleon, MD 6/5/20155:49 AM

## 2014-04-20 ENCOUNTER — Encounter (HOSPITAL_BASED_OUTPATIENT_CLINIC_OR_DEPARTMENT_OTHER): Payer: Self-pay | Admitting: *Deleted

## 2014-04-20 NOTE — Progress Notes (Signed)
Dr Para March wanted pt evaluated preop for airway since he had had chemo and neck radiation post esophageal cancer-dr ossy saw pt-ok for surgery-to come for bmet-ekg

## 2014-04-21 ENCOUNTER — Other Ambulatory Visit: Payer: Self-pay

## 2014-04-21 ENCOUNTER — Encounter (HOSPITAL_BASED_OUTPATIENT_CLINIC_OR_DEPARTMENT_OTHER)
Admission: RE | Admit: 2014-04-21 | Discharge: 2014-04-21 | Disposition: A | Discharge status: Home / Self Care | Payer: BC Managed Care – PPO | Source: Ambulatory Visit | Attending: Orthopedic Surgery | Admitting: Orthopedic Surgery

## 2014-04-21 DIAGNOSIS — Z01810 Encounter for preprocedural cardiovascular examination: Secondary | ICD-10-CM | POA: Insufficient documentation

## 2014-04-21 DIAGNOSIS — Z01812 Encounter for preprocedural laboratory examination: Secondary | ICD-10-CM | POA: Insufficient documentation

## 2014-04-21 LAB — BASIC METABOLIC PANEL
ANION GAP: 12 (ref 5–15)
BUN: 18 mg/dL (ref 6–23)
CO2: 28 mEq/L (ref 19–32)
Calcium: 9.2 mg/dL (ref 8.4–10.5)
Chloride: 101 mEq/L (ref 96–112)
Creatinine, Ser: 1.25 mg/dL (ref 0.50–1.35)
GFR calc Af Amer: 77 mL/min — ABNORMAL LOW (ref >90)
GFR, EST NON AFRICAN AMERICAN: 66 mL/min — AB (ref >90)
Glucose, Bld: 121 mg/dL — ABNORMAL HIGH (ref 70–99)
Potassium: 4.5 mEq/L (ref 3.7–5.3)
Sodium: 141 mEq/L (ref 137–147)

## 2014-04-25 ENCOUNTER — Encounter (HOSPITAL_BASED_OUTPATIENT_CLINIC_OR_DEPARTMENT_OTHER): Payer: Self-pay | Admitting: Physician Assistant

## 2014-04-25 DIAGNOSIS — M75110 Incomplete rotator cuff tear or rupture of unspecified shoulder, not specified as traumatic: Secondary | ICD-10-CM | POA: Diagnosis present

## 2014-04-25 DIAGNOSIS — M19019 Primary osteoarthritis, unspecified shoulder: Secondary | ICD-10-CM | POA: Diagnosis present

## 2014-04-25 DIAGNOSIS — M7541 Impingement syndrome of right shoulder: Secondary | ICD-10-CM | POA: Diagnosis present

## 2014-04-25 NOTE — H&P (Signed)
[name] is an 48 y.o. male.   Chief Complaint: right shoulder pain HPI: [name] is a 48 year old seen for follow-up from his right shoulder partial rotator cuff tear with impingement. He had a right shoulder MRI at [facility] on 06/18/13 that revealed a partial rotator cuff tear of the infraspinatus with impingement and AC joint spurring. He has continued to have pain. We injected his right shoulder 3 weeks ago with minimal relief. He has pain day and night and overhead pain as well. Of note is that he had undergone esophageal cancer resected a number of years ago with no recurrence. 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  Past Medical History  Diagnosis Date  . Cancer     esophagial  . Hypertension   . Seasonal allergies   . Radiation effect 2004    neck-esoph  . Impingement syndrome of right shoulder   . Partial tear of rotator cuff   . Acromioclavicular joint arthritis     Past Surgical History  Procedure Laterality Date  . Upper gastrointestinal endoscopy  2004  . Thoracotomy  2004    right-jejun-pyloroplasty-esoph-resection-cancer  . Port-a-cath removal  2004    insertion and removal    History reviewed. No pertinent family history. Social History:  reports that he has never smoked. He does not have any smokeless tobacco history on file. He reports that he drinks alcohol. His drug history is not on file.  Allergies:  Allergies  Allergen Reactions  . Tetanus Toxoids Other (See Comments)    High fever and fainting, this reaction was as an infant.     No prescriptions prior to admission    No results found for this or any previous visit (from the past 48 hour(s)). No results found.  Review of Systems  Constitutional: Negative.   HENT: Negative.   Eyes: Negative.   Respiratory: Negative.   Cardiovascular: Negative.   Gastrointestinal: Negative.   Genitourinary: Negative.   Musculoskeletal: Positive for joint pain.       Right  shoulder pain  Skin: Negative.   Neurological: Negative.   Endo/Heme/Allergies: Negative.   Psychiatric/Behavioral: Negative.     Height 5\' 10"  (1.778 m), weight 103.42 kg (228 lb). Physical Exam  Constitutional: He is oriented to person, place, and time. He appears well-developed and well-nourished.  HENT:  Head: Normocephalic and atraumatic.  Eyes: Conjunctivae and EOM are normal. Pupils are equal, round, and reactive to light.  Neck: Neck supple.  Cardiovascular: Normal rate.   Respiratory: Effort normal.  GI: Soft.  Genitourinary:  Not pertinent to current symptomatology therefore not examined.  Musculoskeletal:   Examination of his right shoulder reveals full range of motion with pain and impingement on rotator cuff stressing, mild weakness no instability. Exam of his left shoulder reveals full range of motion without pain weakness or instability. Vascular exam: pulses 2+ and symmetric.  Neurological: He is alert and oriented to person, place, and time.  Skin: Skin is warm and dry.  Psychiatric: He has a normal mood and affect.     Assessment Patient Active Problem List   Diagnosis Date Noted  . Impingement syndrome of right shoulder   . Partial tear of rotator cuff   . Acromioclavicular joint arthritis      Plan I talk to him and his wife about this in detail. Would recommend with these findings and his significant persistent pain that we proceed with right shoulder arthroscopy with debridement versus  repair of the rotator cuff with subacromial decompression and distal clavicle excision. Discussed risks benefits and possible complications of the surgery in detail and he understands this completely. We'll plan on setting him up for this at some point in the near future. 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 He will get an anesthesia consult because of his history of esophageal resection for intubation.   [name] J 04/25/2014,  1:50 PM

## 2014-04-27 ENCOUNTER — Encounter (HOSPITAL_BASED_OUTPATIENT_CLINIC_OR_DEPARTMENT_OTHER): Payer: Self-pay | Admitting: *Deleted

## 2014-04-27 ENCOUNTER — Encounter (HOSPITAL_BASED_OUTPATIENT_CLINIC_OR_DEPARTMENT_OTHER)
Admission: RE | Disposition: A | Discharge status: Home / Self Care | Payer: Self-pay | Source: Ambulatory Visit | Attending: Orthopedic Surgery

## 2014-04-27 ENCOUNTER — Encounter (HOSPITAL_BASED_OUTPATIENT_CLINIC_OR_DEPARTMENT_OTHER): Payer: BC Managed Care – PPO | Admitting: Anesthesiology

## 2014-04-27 ENCOUNTER — Ambulatory Visit (HOSPITAL_BASED_OUTPATIENT_CLINIC_OR_DEPARTMENT_OTHER)
Admission: RE | Admit: 2014-04-27 | Discharge: 2014-04-27 | Disposition: A | Discharge status: Home / Self Care | Payer: BC Managed Care – PPO | Source: Ambulatory Visit | Attending: Orthopedic Surgery | Admitting: Orthopedic Surgery

## 2014-04-27 ENCOUNTER — Ambulatory Visit (HOSPITAL_BASED_OUTPATIENT_CLINIC_OR_DEPARTMENT_OTHER): Payer: BC Managed Care – PPO | Admitting: Anesthesiology

## 2014-04-27 DIAGNOSIS — M75110 Incomplete rotator cuff tear or rupture of unspecified shoulder, not specified as traumatic: Secondary | ICD-10-CM | POA: Insufficient documentation

## 2014-04-27 DIAGNOSIS — J301 Allergic rhinitis due to pollen: Secondary | ICD-10-CM | POA: Insufficient documentation

## 2014-04-27 DIAGNOSIS — M7541 Impingement syndrome of right shoulder: Secondary | ICD-10-CM | POA: Diagnosis present

## 2014-04-27 DIAGNOSIS — M19019 Primary osteoarthritis, unspecified shoulder: Secondary | ICD-10-CM | POA: Insufficient documentation

## 2014-04-27 DIAGNOSIS — M7580 Other shoulder lesions, unspecified shoulder: Secondary | ICD-10-CM

## 2014-04-27 DIAGNOSIS — Z8501 Personal history of malignant neoplasm of esophagus: Secondary | ICD-10-CM | POA: Insufficient documentation

## 2014-04-27 DIAGNOSIS — M25819 Other specified joint disorders, unspecified shoulder: Secondary | ICD-10-CM | POA: Insufficient documentation

## 2014-04-27 DIAGNOSIS — M75111 Incomplete rotator cuff tear or rupture of right shoulder, not specified as traumatic: Secondary | ICD-10-CM

## 2014-04-27 DIAGNOSIS — I10 Essential (primary) hypertension: Secondary | ICD-10-CM | POA: Insufficient documentation

## 2014-04-27 HISTORY — DX: Malignant (primary) neoplasm, unspecified: C80.1

## 2014-04-27 HISTORY — DX: Incomplete rotator cuff tear or rupture of unspecified shoulder, not specified as traumatic: M75.110

## 2014-04-27 HISTORY — DX: Primary osteoarthritis, unspecified shoulder: M19.019

## 2014-04-27 HISTORY — DX: Radiation sickness, unspecified, initial encounter: T66.XXXA

## 2014-04-27 HISTORY — DX: Other seasonal allergic rhinitis: J30.2

## 2014-04-27 HISTORY — DX: Impingement syndrome of right shoulder: M75.41

## 2014-04-27 HISTORY — DX: Essential (primary) hypertension: I10

## 2014-04-27 HISTORY — PX: SHOULDER ARTHROSCOPY WITH SUBACROMIAL DECOMPRESSION: SHX5684

## 2014-04-27 LAB — POCT HEMOGLOBIN-HEMACUE: Hemoglobin: 14 g/dL (ref 13.0–17.0)

## 2014-04-27 SURGERY — SHOULDER ARTHROSCOPY WITH SUBACROMIAL DECOMPRESSION
Anesthesia: General | Site: Shoulder | Laterality: Right

## 2014-04-27 MED ORDER — FENTANYL CITRATE 0.05 MG/ML IJ SOLN
INTRAMUSCULAR | Status: AC
Start: 2014-04-27 — End: 2014-04-27
  Filled 2014-04-27: qty 2

## 2014-04-27 MED ORDER — FENTANYL CITRATE 0.05 MG/ML IJ SOLN
INTRAMUSCULAR | Status: AC
  Filled 2014-04-27: qty 2

## 2014-04-27 MED ORDER — FENTANYL CITRATE 0.05 MG/ML IJ SOLN
50.0000 ug | INTRAMUSCULAR | Status: DC | PRN
  Administered 2014-04-27: 100 ug via INTRAVENOUS

## 2014-04-27 MED ORDER — LACTATED RINGERS IV SOLN
INTRAVENOUS | Status: DC
  Administered 2014-04-27 (×2): via INTRAVENOUS

## 2014-04-27 MED ORDER — DIAZEPAM 2 MG PO TABS: 2.0000 mg | ORAL_TABLET | Freq: Three times a day (TID) | ORAL | Status: DC | PRN

## 2014-04-27 MED ORDER — LIDOCAINE HCL (CARDIAC) 20 MG/ML IV SOLN
INTRAVENOUS | Status: DC | PRN
  Administered 2014-04-27: 80 mg via INTRAVENOUS

## 2014-04-27 MED ORDER — BUPIVACAINE-EPINEPHRINE (PF) 0.5% -1:200000 IJ SOLN
INTRAMUSCULAR | Status: DC | PRN
  Administered 2014-04-27: 25 mL via PERINEURAL

## 2014-04-27 MED ORDER — HYDROMORPHONE HCL PF 1 MG/ML IJ SOLN
INTRAMUSCULAR | Status: AC
  Filled 2014-04-27: qty 1

## 2014-04-27 MED ORDER — EPHEDRINE SULFATE 50 MG/ML IJ SOLN
INTRAMUSCULAR | Status: DC | PRN
  Administered 2014-04-27: 10 mg via INTRAVENOUS

## 2014-04-27 MED ORDER — PROPOFOL 10 MG/ML IV BOLUS
INTRAVENOUS | Status: DC | PRN
  Administered 2014-04-27: 400 mg via INTRAVENOUS

## 2014-04-27 MED ORDER — DEXAMETHASONE SODIUM PHOSPHATE 4 MG/ML IJ SOLN
INTRAMUSCULAR | Status: DC | PRN
  Administered 2014-04-27: 10 mg via INTRAVENOUS

## 2014-04-27 MED ORDER — FENTANYL CITRATE 0.05 MG/ML IJ SOLN
INTRAMUSCULAR | Status: DC | PRN
  Administered 2014-04-27 (×2): 25 ug via INTRAVENOUS

## 2014-04-27 MED ORDER — LIDOCAINE HCL 4 % MT SOLN
OROMUCOSAL | Status: DC | PRN
  Administered 2014-04-27: 2 mL via TOPICAL

## 2014-04-27 MED ORDER — PHENYLEPHRINE HCL 10 MG/ML IJ SOLN
INTRAMUSCULAR | Status: DC | PRN
  Administered 2014-04-27: 40 ug via INTRAVENOUS

## 2014-04-27 MED ORDER — CHLORHEXIDINE GLUCONATE 4 % EX LIQD: 60.0000 mL | Freq: Once | CUTANEOUS | Status: DC

## 2014-04-27 MED ORDER — ONDANSETRON HCL 4 MG/2ML IJ SOLN: 4.0000 mg | Freq: Once | INTRAMUSCULAR | Status: DC | PRN

## 2014-04-27 MED ORDER — OXYCODONE HCL 5 MG/5ML PO SOLN: 5.0000 mg | Freq: Once | ORAL | Status: AC | PRN

## 2014-04-27 MED ORDER — MIDAZOLAM HCL 2 MG/2ML IJ SOLN
INTRAMUSCULAR | Status: AC
  Filled 2014-04-27: qty 2

## 2014-04-27 MED ORDER — HYDROMORPHONE HCL PF 1 MG/ML IJ SOLN
0.2500 mg | INTRAMUSCULAR | Status: DC | PRN
  Administered 2014-04-27 (×4): 0.5 mg via INTRAVENOUS

## 2014-04-27 MED ORDER — OXYCODONE HCL 5 MG PO TABS
ORAL_TABLET | ORAL | Status: AC
  Filled 2014-04-27: qty 1

## 2014-04-27 MED ORDER — SUCCINYLCHOLINE CHLORIDE 20 MG/ML IJ SOLN
INTRAMUSCULAR | Status: DC | PRN
  Administered 2014-04-27: 120 mg via INTRAVENOUS

## 2014-04-27 MED ORDER — CEFAZOLIN SODIUM-DEXTROSE 2-3 GM-% IV SOLR
INTRAVENOUS | Status: AC
  Filled 2014-04-27: qty 50

## 2014-04-27 MED ORDER — OXYCODONE HCL 5 MG PO TABS: ORAL_TABLET | ORAL | Status: DC

## 2014-04-27 MED ORDER — CEFAZOLIN SODIUM-DEXTROSE 2-3 GM-% IV SOLR
2.0000 g | INTRAVENOUS | Status: AC
  Administered 2014-04-27: 2 g via INTRAVENOUS

## 2014-04-27 MED ORDER — MIDAZOLAM HCL 2 MG/2ML IJ SOLN
1.0000 mg | INTRAMUSCULAR | Status: DC | PRN
  Administered 2014-04-27: 2 mg via INTRAVENOUS

## 2014-04-27 MED ORDER — OXYCODONE HCL 5 MG PO TABS
5.0000 mg | ORAL_TABLET | Freq: Once | ORAL | Status: AC | PRN
  Administered 2014-04-27: 5 mg via ORAL

## 2014-04-27 SURGICAL SUPPLY — 78 items
APL SKNCLS STERI-STRIP NONHPOA (GAUZE/BANDAGES/DRESSINGS)
BENZOIN TINCTURE PRP APPL 2/3 (GAUZE/BANDAGES/DRESSINGS) IMPLANT
BLADE CUDA 5.5 (BLADE) IMPLANT
BLADE CUTTER GATOR 3.5 (BLADE) ×3 IMPLANT
BLADE GREAT WHITE 4.2 (BLADE) IMPLANT
BLADE GREAT WHITE 4.2MM (BLADE)
BLADE SURG 15 STRL LF DISP TIS (BLADE) IMPLANT
BLADE SURG 15 STRL SS (BLADE)
BLADE SURG ROTATE 9660 (MISCELLANEOUS) IMPLANT
BNDG COHESIVE 4X5 TAN STRL (GAUZE/BANDAGES/DRESSINGS) ×3 IMPLANT
BUR OVAL 6.0 (BURR) ×3 IMPLANT
CANISTER SUCT 3000ML (MISCELLANEOUS) IMPLANT
CANNULA TWIST IN 8.25X7CM (CANNULA) IMPLANT
CLOSURE WOUND 1/2 X4 (GAUZE/BANDAGES/DRESSINGS)
DECANTER SPIKE VIAL GLASS SM (MISCELLANEOUS) IMPLANT
DRAPE SHOULDER BEACH CHAIR (DRAPES) ×3 IMPLANT
DRAPE U-SHAPE 47X51 STRL (DRAPES) ×6 IMPLANT
DRSG PAD ABDOMINAL 8X10 ST (GAUZE/BANDAGES/DRESSINGS) ×3 IMPLANT
DURAPREP 26ML APPLICATOR (WOUND CARE) ×3 IMPLANT
ELECT REM PT RETURN 9FT ADLT (ELECTROSURGICAL) ×3
ELECTRODE REM PT RTRN 9FT ADLT (ELECTROSURGICAL) ×1 IMPLANT
GAUZE SPONGE 4X4 12PLY STRL (GAUZE/BANDAGES/DRESSINGS) ×3 IMPLANT
GAUZE XEROFORM 1X8 LF (GAUZE/BANDAGES/DRESSINGS) ×3 IMPLANT
GLOVE BIO SURGEON STRL SZ7 (GLOVE) IMPLANT
GLOVE BIOGEL PI IND STRL 7.0 (GLOVE) IMPLANT
GLOVE BIOGEL PI IND STRL 7.5 (GLOVE) ×1 IMPLANT
GLOVE BIOGEL PI INDICATOR 7.0 (GLOVE)
GLOVE BIOGEL PI INDICATOR 7.5 (GLOVE) ×2
GLOVE SS BIOGEL STRL SZ 7.5 (GLOVE) ×1 IMPLANT
GLOVE SUPERSENSE BIOGEL SZ 7.5 (GLOVE) ×2
GOWN STRL REUS W/ TWL LRG LVL3 (GOWN DISPOSABLE) ×3 IMPLANT
GOWN STRL REUS W/TWL LRG LVL3 (GOWN DISPOSABLE) ×9
LOOP 2 FIBERLINK CLOSED (SUTURE) IMPLANT
MANIFOLD NEPTUNE II (INSTRUMENTS) ×3 IMPLANT
NDL SAFETY ECLIPSE 18X1.5 (NEEDLE) ×1 IMPLANT
NDL SUT 6 .5 CRC .975X.05 MAYO (NEEDLE) IMPLANT
NEEDLE 1/2 CIR CATGUT .05X1.09 (NEEDLE) IMPLANT
NEEDLE HYPO 18GX1.5 SHARP (NEEDLE) ×2
NEEDLE MAYO TAPER (NEEDLE)
NEEDLE SCORPION MULTI FIRE (NEEDLE) IMPLANT
PACK ARTHROSCOPY DSU (CUSTOM PROCEDURE TRAY) ×3 IMPLANT
PACK BASIN DAY SURGERY FS (CUSTOM PROCEDURE TRAY) ×3 IMPLANT
PAD ALCOHOL SWAB (MISCELLANEOUS) ×6 IMPLANT
PENCIL BUTTON HOLSTER BLD 10FT (ELECTRODE) IMPLANT
SET ARTHROSCOPY TUBING (MISCELLANEOUS) ×3
SET ARTHROSCOPY TUBING LN (MISCELLANEOUS) ×1 IMPLANT
SHEET MEDIUM DRAPE 40X70 STRL (DRAPES) IMPLANT
SLEEVE SCD COMPRESS KNEE MED (MISCELLANEOUS) IMPLANT
SLING ARM IMMOBILIZER MED (SOFTGOODS) IMPLANT
SLING ARM LRG ADULT FOAM STRAP (SOFTGOODS) IMPLANT
SLING ARM MED ADULT FOAM STRAP (SOFTGOODS) IMPLANT
SLING ARM XL FOAM STRAP (SOFTGOODS) IMPLANT
SLING ULTRA III MED (ORTHOPEDIC SUPPLIES) IMPLANT
SPONGE LAP 4X18 X RAY DECT (DISPOSABLE) IMPLANT
STRIP CLOSURE SKIN 1/2X4 (GAUZE/BANDAGES/DRESSINGS) IMPLANT
SUCTION FRAZIER TIP 10 FR DISP (SUCTIONS) IMPLANT
SUT ETHILON 3 0 PS 1 (SUTURE) ×3 IMPLANT
SUT FIBERWIRE #2 38 T-5 BLUE (SUTURE)
SUT PDS AB 2-0 CT2 27 (SUTURE) IMPLANT
SUT PROLENE 3 0 PS 2 (SUTURE) IMPLANT
SUT TIGER TAPE 7 IN WHITE (SUTURE) IMPLANT
SUT VIC AB 0 SH 27 (SUTURE) IMPLANT
SUT VIC AB 2-0 PS2 27 (SUTURE) IMPLANT
SUT VIC AB 2-0 SH 27 (SUTURE)
SUT VIC AB 2-0 SH 27XBRD (SUTURE) IMPLANT
SUTURE FIBERWR #2 38 T-5 BLUE (SUTURE) IMPLANT
SYR BULB 3OZ (MISCELLANEOUS) IMPLANT
SYRINGE 20CC LL (MISCELLANEOUS) IMPLANT
SYRINGE 6CC (MISCELLANEOUS) ×3 IMPLANT
TAPE FIBER 2MM 7IN #2 BLUE (SUTURE) IMPLANT
TAPE HYPAFIX 6 X30' (GAUZE/BANDAGES/DRESSINGS)
TAPE HYPAFIX 6X30 (GAUZE/BANDAGES/DRESSINGS) IMPLANT
TAPE STRIPS DRAPE STRL (GAUZE/BANDAGES/DRESSINGS) ×3 IMPLANT
TOWEL OR 17X24 6PK STRL BLUE (TOWEL DISPOSABLE) ×3 IMPLANT
TUBE CONNECTING 20'X1/4 (TUBING)
TUBE CONNECTING 20X1/4 (TUBING) IMPLANT
WAND STAR VAC 90 (SURGICAL WAND) ×3 IMPLANT
WATER STERILE IRR 1000ML POUR (IV SOLUTION) ×3 IMPLANT

## 2014-04-27 NOTE — Interval H&P Note (Signed)
History and Physical Interval Note:  04/27/2014 11:51 AM  [name]  has presented today for surgery, with the diagnosis of RIGHT SHOULDER DISORDER ARTICULAR CARTILAGE/IMPINGEMENT SYNDROME-SHOULDER  The various methods of treatment have been discussed with the patient and family. After consideration of risks, benefits and other options for treatment, the patient has consented to  Procedure(s) with comments: RIGHT SHOULDER ARTHROSCOPY DEBRIDEMENT ESTENTSIVE/DISTAL CLAVICULECTOMY/SUBACROMIAL DECOMPRESSION/PARTIAL ACROMIOPLASTY WITH CORACOACROMIAL RELEASE (Right) - LEAVE CASE AT 12 NOON PER DR.Cailan General/TM as a surgical intervention .  The patient's history has been reviewed, patient examined, no change in status, stable for surgery.  I have reviewed the patient's chart and labs.  Questions were answered to the patient's satisfaction.     Elsie Saas A

## 2014-04-27 NOTE — Anesthesia Postprocedure Evaluation (Signed)
  Anesthesia Post-op Note  Patient: [name]  Procedure(s) Performed: Procedure(s): RIGHT SHOULDER ARTHROSCOPY DEBRIDEMENT EXTENSIVE/DISTAL CLAVICULECTOMY/SUBACROMIAL DECOMPRESSION/PARTIAL ACROMIOPLASTY WITH DEBRIDEMENT OF PARTIAL ROTATOR CUFF TEAR (Right)  Patient Location: PACU  Anesthesia Type: General  And regional for post op pain   Level of Consciousness: awake, alert  and oriented  Airway and Oxygen Therapy: Patient Spontanous Breathing  Post-op Pain: mild  Post-op Assessment: Post-op Vital signs reviewed  Post-op Vital Signs: Reviewed  Last Vitals:  Filed Vitals:   04/27/14 1330  BP: 134/76  Pulse: 73  Temp:   Resp: 13    Complications: No apparent anesthesia complications Anesthesia Post-op Note  Patient: [name]  Procedure(s) Performed: Procedure(s): RIGHT SHOULDER ARTHROSCOPY DEBRIDEMENT EXTENSIVE/DISTAL CLAVICULECTOMY/SUBACROMIAL DECOMPRESSION/PARTIAL ACROMIOPLASTY WITH DEBRIDEMENT OF PARTIAL ROTATOR CUFF TEAR (Right)  Patient Location: PACU  Anesthesia Type:GA combined with regional for post-op pain  Level of Consciousness: awake, alert  and oriented  Airway and Oxygen Therapy: Patient Spontanous Breathing  Post-op Pain: mild  Post-op Assessment: Post-op Vital signs reviewed  Post-op Vital Signs Reviewed  Last Vitals:  Filed Vitals:   04/27/14 1330  BP: 134/76  Pulse: 73  Temp:   Resp: 13    Complications: No apparent complications

## 2014-04-27 NOTE — Transfer of Care (Signed)
Immediate Anesthesia Transfer of Care Note  Patient: [name]  Procedure(s) Performed: Procedure(s): RIGHT SHOULDER ARTHROSCOPY DEBRIDEMENT EXTENSIVE/DISTAL CLAVICULECTOMY/SUBACROMIAL DECOMPRESSION/PARTIAL ACROMIOPLASTY WITH DEBRIDEMENT OF PARTIAL ROTATOR CUFF TEAR (Right)  Patient Location: PACU  Anesthesia Type:GA combined with regional for post-op pain  Level of Consciousness: sedated  Airway & Oxygen Therapy: Patient Spontanous Breathing and Patient connected to face mask oxygen  Post-op Assessment: Report given to PACU RN and Post -op Vital signs reviewed and stable  Post vital signs: Reviewed and stable  Complications: No apparent anesthesia complications

## 2014-04-27 NOTE — Anesthesia Procedure Notes (Signed)
Anesthesia Regional Block:  Interscalene brachial plexus block  Pre-Anesthetic Checklist: ,, timeout performed, Correct Patient, Correct Site, Correct Laterality, Correct Procedure, Correct Position, site marked, Risks and benefits discussed,  Surgical consent,  Pre-op evaluation,  At surgeon's request and post-op pain management  Laterality: Right and Upper  Prep: chloraprep       Needles:  Injection technique: Single-shot  Needle Type: Echogenic Needle     Needle Length: 5cm 5 cm Needle Gauge: 21 and 21 G    Additional Needles:  Procedures: ultrasound guided (picture in chart) Interscalene brachial plexus block Narrative:  Start time: 04/27/2014 11:18 AM End time: 04/27/2014 11:24 AM Injection made incrementally with aspirations every 5 mL.  Performed by: Personally  Anesthesiologist: Lorrene Reid, MD

## 2014-04-27 NOTE — Progress Notes (Signed)
Assisted Dr. Crews with right, ultrasound guided, interscalene  block. Side rails up, monitors on throughout procedure. See vital signs in flow sheet. Tolerated Procedure well. 

## 2014-04-27 NOTE — Discharge Instructions (Signed)

## 2014-04-27 NOTE — Anesthesia Preprocedure Evaluation (Signed)
Anesthesia Evaluation  Patient identified by MRN, date of birth, ID band Patient awake    Reviewed: Allergy & Precautions, H&P , NPO status , Patient's Chart, lab work & pertinent test results  Airway Mallampati: I  TM Distance: >3 FB Neck ROM: Full    Dental  (+) Teeth Intact, Dental Advisory Given   Pulmonary  breath sounds clear to auscultation        Cardiovascular hypertension, Pt. on medications Rhythm:Regular Rate:Normal     Neuro/Psych    GI/Hepatic   Endo/Other    Renal/GU      Musculoskeletal   Abdominal   Peds  Hematology   Anesthesia Other Findings   Reproductive/Obstetrics                             Anesthesia Physical Anesthesia Plan  ASA: III  Anesthesia Plan: General   Post-op Pain Management:    Induction: Intravenous  Airway Management Planned: Oral ETT  Additional Equipment:   Intra-op Plan:   Post-operative Plan: Extubation in OR  Informed Consent: I have reviewed the patients History and Physical, chart, labs and discussed the procedure including the risks, benefits and alternatives for the proposed anesthesia with the patient or authorized representative who has indicated his/her understanding and acceptance.   Dental advisory given  Plan Discussed with: CRNA, Anesthesiologist and Surgeon  Anesthesia Plan Comments:         Anesthesia Quick Evaluation  

## 2014-04-28 ENCOUNTER — Encounter (HOSPITAL_BASED_OUTPATIENT_CLINIC_OR_DEPARTMENT_OTHER): Payer: Self-pay | Admitting: Orthopedic Surgery

## 2014-04-28 NOTE — Op Note (Signed)
[name], [name]                   ACCOUNT NO.:  [id number]  MEDICAL RECORD NO.:  [id number]  LOCATION:                                 FACILITY:  PHYSICIAN:  Tarvares Lant A. Noemi Chapel, M.D. DATE OF BIRTH:  11/26/1965  DATE OF PROCEDURE:  04/27/2014 DATE OF DISCHARGE:  04/27/2014                              OPERATIVE REPORT   PREOPERATIVE DIAGNOSES: 1. Right shoulder partial rotator cuff tear. 2. Right shoulder impingement. 3. Right shoulder acromioclavicular joint degenerative joint disease     and spurring.  POSTOPERATIVE DIAGNOSES: 1. Right shoulder partial rotator cuff tear. 2. Right shoulder impingement. 3. Right shoulder acromioclavicular joint degenerative joint disease     and spurring.  PROCEDURE: 1. Right shoulder examination under anesthesia followed by     arthroscopic debridement of partial rotator cuff tear. 2. Right shoulder subacromial decompression. 3. Right shoulder distal clavicle excision.  SURGEON:  Audree Camel. Noemi Chapel, MD  ANESTHESIA:  General.  OPERATIVE TIME:  About 45 minutes.  COMPLICATIONS:  None.  INDICATION FOR PROCEDURE:  [name] is a 48 year old gentleman who has had over a year of right shoulder pain with exam and MRI documenting partial rotator cuff tear with impingement and AC joint arthropathy.  He has failed conservative care and is now to undergo arthroscopy.  DESCRIPTION:  [name] was brought to the operating room on April 27, 2014, after an interscalene block was placed in the holding room by Anesthesia.  He was placed on operative table in supine position.  He received antibiotics preoperatively for prophylaxis.  After being placed under general anesthesia, his right shoulder was examined.  He had full range of motion of shoulder with stable ligamentous exam.  He was then placed in a beach-chair position and shoulder and arm was prepped using sterile DuraPrep and draped using sterile technique.  A time-out procedure was called and the  correct right shoulder was identified. Initially, through a posterior arthroscopic portal, the arthroscope with a pump attached was placed into an anterior portal and an arthroscopic probe was placed.  On initial inspection, the articular cartilage in the glenohumeral joint was intact.  The anterior and posterior labrum was intact.  The anterior-inferior glenohumeral ligament complex was intact. Biceps tendon anchor was intact.  The biceps tendon showed partial tearing 15% to 20%, which was debrided.  Rotator cuff showed a partial tear of 15% in the undersurface of the supraspinatus, which was debrided.  The rest of the rotator cuff was intact.  Inferior capsular recess was free of pathology.  Subacromial space was entered and a lateral arthroscopic portal was made.  Moderately thickened bursitis was resected.  The rotator cuff was frayed on the bursal surface, but no deep tearing was noted.  Impingement was noted and a subacromial decompression was carried out removing 6-8 mm of the undersurface of the anterior, anterolateral, and anteromedial acromion and CA ligament release carried out as well.  The [facility] joint showed significant spurring and degenerative changes and impingement and the distal 8-10 mm of clavicle was resected with a 6-mm bur.  After this was done, the shoulder could be brought through a full range of motion with  no impingement on the rotator cuff.  At this point, it was felt that all pathology had been satisfactorily addressed.  The instruments were removed.  Portal was closed with 3-0 nylon suture.  Sterile dressings and a sling applied.  The patient awakened, taken to recovery room in stable condition.  FOLLOWUP CARE:  [name] will be followed as an outpatient on OxyIR and Valium with early physical therapy.  He will be seen back in the office in a week for sutures out and followup.     Brandilynn Taormina A. Noemi Chapel, M.D.     RAW/MEDQ  D:  04/27/2014  T:  04/28/2014  Job:   [id number]

## 2015-03-24 ENCOUNTER — Other Ambulatory Visit (HOSPITAL_BASED_OUTPATIENT_CLINIC_OR_DEPARTMENT_OTHER): Payer: BLUE CROSS/BLUE SHIELD

## 2015-03-24 ENCOUNTER — Ambulatory Visit (HOSPITAL_BASED_OUTPATIENT_CLINIC_OR_DEPARTMENT_OTHER): Payer: BLUE CROSS/BLUE SHIELD | Admitting: Hematology & Oncology

## 2015-03-24 VITALS — BP 160/91 | HR 80 | Temp 98.1°F | Resp 18 | Ht 70.0 in | Wt 216.0 lb

## 2015-03-24 DIAGNOSIS — Z8501 Personal history of malignant neoplasm of esophagus: Secondary | ICD-10-CM

## 2015-03-24 DIAGNOSIS — E559 Vitamin D deficiency, unspecified: Secondary | ICD-10-CM

## 2015-03-24 DIAGNOSIS — Z125 Encounter for screening for malignant neoplasm of prostate: Secondary | ICD-10-CM

## 2015-03-24 DIAGNOSIS — C159 Malignant neoplasm of esophagus, unspecified: Secondary | ICD-10-CM

## 2015-03-24 LAB — CBC WITH DIFFERENTIAL (CANCER CENTER ONLY)
BASO#: 0 10*3/uL (ref 0.0–0.2)
BASO%: 0.4 % (ref 0.0–2.0)
EOS%: 3.9 % (ref 0.0–7.0)
Eosinophils Absolute: 0.3 10*3/uL (ref 0.0–0.5)
HCT: 46.9 % (ref 38.7–49.9)
HEMOGLOBIN: 15.9 g/dL (ref 13.0–17.1)
LYMPH#: 1.1 10*3/uL (ref 0.9–3.3)
LYMPH%: 12.8 % — ABNORMAL LOW (ref 14.0–48.0)
MCH: 27.8 pg — ABNORMAL LOW (ref 28.0–33.4)
MCHC: 33.9 g/dL (ref 32.0–35.9)
MCV: 82 fL (ref 82–98)
MONO#: 0.6 10*3/uL (ref 0.1–0.9)
MONO%: 7.1 % (ref 0.0–13.0)
NEUT#: 6.2 10*3/uL (ref 1.5–6.5)
NEUT%: 75.8 % (ref 40.0–80.0)
Platelets: 318 10*3/uL (ref 145–400)
RBC: 5.71 10*6/uL — AB (ref 4.20–5.70)
RDW: 13.9 % (ref 11.1–15.7)
WBC: 8.2 10*3/uL (ref 4.0–10.0)

## 2015-03-24 NOTE — Progress Notes (Signed)
Hematology and Oncology Follow Up Visit  [name] [id number] December 12, 1965 49 y.o. 03/24/2015   Principle Diagnosis:   Stage III (T3N1M0) adenocarcinoma of the esophagus  Current Therapy:    Observation     Interim History:  [name] is comes in for his 1 year followup. He is doing quite well. He's working. Is now 10 years since he had his treatment after surgery. He did have chemotherapy and radiation therapy.  There is no dysphasia. He's had no pain. He's had no shortness of breath. There's been no change in bowel or bladder habits. He's had a rashes. He's had no headache.   He's had no issues with headache. He's had some weight loss. He is exercising more.  He's had no bleeding or bruising. If he is due for a colonoscopy. He will see Dr. Collene Mares for this. He promises me that he will have this done.  Overall, his performance status is ECOG 0.   Medications:  Current outpatient prescriptions:  .  aspirin 81 MG tablet, Take 81 mg by mouth daily.  , Disp: , Rfl:  .  cetirizine (ZYRTEC) 10 MG tablet, Take 10 mg by mouth daily.  , Disp: , Rfl:  .  Cholecalciferol ([employer]S VIT D 5000 HIGH-POTENCY) 5000 UNITS capsule, Take 5,000 Units by mouth daily.  , Disp: , Rfl:  .  diazepam (VALIUM) 2 MG tablet, Take 1 tablet (2 mg total) by mouth every 8 (eight) hours as needed for muscle spasms., Disp: 45 tablet, Rfl: 0 .  losartan-hydrochlorothiazide (HYZAAR) 100-25 MG per tablet, 100 mg Daily., Disp: , Rfl:  .  Multiple Vitamin (MULTIVITAMIN) tablet, Take 1 tablet by mouth daily.  , Disp: , Rfl:  .  omeprazole (PRILOSEC) 20 MG capsule, Take 20 mg by mouth 2 (two) times daily.  , Disp: , Rfl:  .  pseudoephedrine (SUDAFED) 30 MG tablet, Take 30 mg by mouth as needed for congestion., Disp: , Rfl:   Allergies:  Allergies  Allergen Reactions  . Tetanus Toxoids Other (See Comments)    High fever and fainting, this reaction was as an infant.     Past Medical History, Surgical history, Social history,  and Family History were reviewed and updated.  Review of Systems: As above  Physical Exam:  height is 5\' 10"  (1.778 m) and weight is 216 lb (97.977 kg). His oral temperature is 98.1 F (36.7 C). His blood pressure is 160/91 and his pulse is 80. His respiration is 18.   Head and neck exam shows no ocular or oral lesions. He has no palpable cervical or supraclavicular lymph nodes. Lungs are clear. Cardiac exam regular rate and rhythm with no murmurs, rubs or bruits.. Abdomen is soft. He has good bowel sounds. There is no fluid wave. There is no palpable liver or spleen. Back exam shows the thoracotomy scar in the right lateral chest wall. No tenderness noted over the spine, ribs or hips. Extremities shows no clubbing cyanosis or edema. Has good strength and range of motion of his joints. Skin exam shows no rashes. Neurological exam is non-focal.  Lab Results  Component Value Date   WBC 8.2 03/24/2015   HGB 15.9 03/24/2015   HCT 46.9 03/24/2015   MCV 82 03/24/2015   PLT 318 03/24/2015     Chemistry      Component Value Date/Time   NA 141 04/21/2014 1100   NA 141 02/22/2010 0801   K 4.5 04/21/2014 1100   K 4.3 02/22/2010 0801  CL 101 04/21/2014 1100   CL 99 02/22/2010 0801   CO2 28 04/21/2014 1100   CO2 30 02/22/2010 0801   BUN 18 04/21/2014 1100   BUN 14 02/22/2010 0801   CREATININE 1.25 04/21/2014 1100   CREATININE 1.3* 02/22/2010 0801      Component Value Date/Time   CALCIUM 9.2 04/21/2014 1100   CALCIUM 9.4 02/22/2010 0801   ALKPHOS 74 03/24/2014 0902   ALKPHOS 85* 02/22/2010 0801   AST 11 03/24/2014 0902   AST 23 02/22/2010 0801   ALT 9 03/24/2014 0902   ALT 23 02/22/2010 0801   BILITOT 0.5 03/24/2014 0902   BILITOT 0.70 02/22/2010 0801         Impression and Plan: [name] is 49 year old gentleman with a history of stage III adenocarcinoma of the distal esophagus. He underwent a resection. He had positive margins and we gave him adjuvant chemotherapy and  radiation therapy.  As always, it is a lot fun talking to him. 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  We will see him back in one more year.   Volanda Napoleon, MD 6/2/20169:56 AM

## 2015-03-25 LAB — COMPREHENSIVE METABOLIC PANEL
ALBUMIN: 4 g/dL (ref 3.5–5.2)
ALT: 10 U/L (ref 0–53)
AST: 15 U/L (ref 0–37)
Alkaline Phosphatase: 82 U/L (ref 39–117)
BUN: 17 mg/dL (ref 6–23)
CO2: 28 mEq/L (ref 19–32)
Calcium: 9.5 mg/dL (ref 8.4–10.5)
Chloride: 101 mEq/L (ref 96–112)
Creatinine, Ser: 1.26 mg/dL (ref 0.50–1.35)
GLUCOSE: 106 mg/dL — AB (ref 70–99)
Potassium: 3.8 mEq/L (ref 3.5–5.3)
SODIUM: 137 meq/L (ref 135–145)
TOTAL PROTEIN: 7.5 g/dL (ref 6.0–8.3)
Total Bilirubin: 0.5 mg/dL (ref 0.2–1.2)

## 2015-03-25 LAB — VITAMIN D 25 HYDROXY (VIT D DEFICIENCY, FRACTURES): Vit D, 25-Hydroxy: 32 ng/mL (ref 30–100)

## 2015-03-28 ENCOUNTER — Telehealth: Payer: Self-pay | Admitting: *Deleted

## 2015-03-28 NOTE — Telephone Encounter (Addendum)
Patient aware of results.   ----- Message from Volanda Napoleon, MD sent at 03/24/2015  5:24 PM EDT ----- Call and tell him that his electrolytes are normal. Thanks

## 2016-03-23 ENCOUNTER — Other Ambulatory Visit: Payer: BLUE CROSS/BLUE SHIELD

## 2016-03-23 ENCOUNTER — Ambulatory Visit: Payer: BLUE CROSS/BLUE SHIELD | Admitting: Hematology & Oncology

## 2016-04-04 ENCOUNTER — Other Ambulatory Visit: Payer: Self-pay | Admitting: *Deleted

## 2016-04-04 DIAGNOSIS — C159 Malignant neoplasm of esophagus, unspecified: Secondary | ICD-10-CM

## 2016-04-05 ENCOUNTER — Other Ambulatory Visit (HOSPITAL_BASED_OUTPATIENT_CLINIC_OR_DEPARTMENT_OTHER): Payer: BC Managed Care – PPO

## 2016-04-05 ENCOUNTER — Ambulatory Visit (HOSPITAL_BASED_OUTPATIENT_CLINIC_OR_DEPARTMENT_OTHER): Payer: BC Managed Care – PPO | Admitting: Hematology & Oncology

## 2016-04-05 ENCOUNTER — Encounter: Payer: Self-pay | Admitting: Hematology & Oncology

## 2016-04-05 VITALS — BP 147/84 | HR 81 | Temp 97.4°F | Resp 16 | Ht 70.0 in | Wt 231.0 lb

## 2016-04-05 DIAGNOSIS — C155 Malignant neoplasm of lower third of esophagus: Secondary | ICD-10-CM

## 2016-04-05 DIAGNOSIS — C159 Malignant neoplasm of esophagus, unspecified: Secondary | ICD-10-CM

## 2016-04-05 DIAGNOSIS — Z8501 Personal history of malignant neoplasm of esophagus: Secondary | ICD-10-CM | POA: Diagnosis not present

## 2016-04-05 HISTORY — DX: Malignant neoplasm of esophagus, unspecified: C15.9

## 2016-04-05 LAB — CBC WITH DIFFERENTIAL (CANCER CENTER ONLY)
BASO#: 0 10*3/uL (ref 0.0–0.2)
BASO%: 0.3 % (ref 0.0–2.0)
EOS%: 4.7 % (ref 0.0–7.0)
Eosinophils Absolute: 0.4 10*3/uL (ref 0.0–0.5)
HEMATOCRIT: 46 % (ref 38.7–49.9)
HGB: 15.8 g/dL (ref 13.0–17.1)
LYMPH#: 1.2 10*3/uL (ref 0.9–3.3)
LYMPH%: 15.6 % (ref 14.0–48.0)
MCH: 29 pg (ref 28.0–33.4)
MCHC: 34.3 g/dL (ref 32.0–35.9)
MCV: 84 fL (ref 82–98)
MONO#: 0.7 10*3/uL (ref 0.1–0.9)
MONO%: 8.3 % (ref 0.0–13.0)
NEUT#: 5.6 10*3/uL (ref 1.5–6.5)
NEUT%: 71.1 % (ref 40.0–80.0)
Platelets: 321 10*3/uL (ref 145–400)
RBC: 5.45 10*6/uL (ref 4.20–5.70)
RDW: 13.4 % (ref 11.1–15.7)
WBC: 7.9 10*3/uL (ref 4.0–10.0)

## 2016-04-05 LAB — COMPREHENSIVE METABOLIC PANEL
ALT: 20 U/L (ref 0–55)
ANION GAP: 8 meq/L (ref 3–11)
AST: 15 U/L (ref 5–34)
Albumin: 3.9 g/dL (ref 3.5–5.0)
Alkaline Phosphatase: 69 U/L (ref 40–150)
BUN: 18.1 mg/dL (ref 7.0–26.0)
CALCIUM: 9.6 mg/dL (ref 8.4–10.4)
CHLORIDE: 103 meq/L (ref 98–109)
CO2: 31 mEq/L — ABNORMAL HIGH (ref 22–29)
Creatinine: 1.4 mg/dL — ABNORMAL HIGH (ref 0.7–1.3)
EGFR: 60 mL/min/{1.73_m2} — ABNORMAL LOW (ref >90)
Glucose: 130 mg/dl (ref 70–140)
POTASSIUM: 3.9 meq/L (ref 3.5–5.1)
Sodium: 142 mEq/L (ref 136–145)
Total Bilirubin: 0.53 mg/dL (ref 0.20–1.20)
Total Protein: 7.7 g/dL (ref 6.4–8.3)

## 2016-04-05 NOTE — Progress Notes (Signed)
Hematology and Oncology Follow Up Visit  [name] [id number] [birth year] 50 y.o. 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  Skinner is comes in for his 1 year followup. He is doing quite well. He's working. He is working for her new pharmacy. The pharmacy is a local pharmacy which is much less stressful for him.  It is now 11 years since he had his treatment after surgery. He did have chemotherapy and radiation therapy.  There is no dysphasia. He's had no pain. He's had no shortness of breath. There's been no change in bowel or bladder habits. He's had a rashes. He's had no headache.   He's had no issues with headache. He's had some weight loss. He is exercising more.  He's had no bleeding or bruising.   His daughter is doing well. His wife is doing well. She is a Pharmacist, hospital.  As always, we covered a lot of current topics dealing with sports, politics, social media, etc.  Overall, his performance status is ECOG 0.   Medications:  Current outpatient prescriptions:  .  aspirin 81 MG tablet, Take 81 mg by mouth daily.  , Disp: , Rfl:  .  cetirizine (ZYRTEC) 10 MG tablet, Take 10 mg by mouth daily.  , Disp: , Rfl:  .  Cholecalciferol ([employer]S VIT D 5000 HIGH-POTENCY) 5000 UNITS capsule, Take 5,000 Units by mouth daily.  , Disp: , Rfl:  .  fluticasone (FLONASE) 50 MCG/ACT nasal spray, , Disp: , Rfl:  .  losartan-hydrochlorothiazide (HYZAAR) 100-25 MG per tablet, 100 mg Daily., Disp: , Rfl:  .  Multiple Vitamin (MULTIVITAMIN) tablet, Take 1 tablet by mouth daily.  , Disp: , Rfl:  .  omeprazole (PRILOSEC) 20 MG capsule, Take 20 mg by mouth 2 (two) times daily.  , Disp: , Rfl:  .  pseudoephedrine (SUDAFED) 30 MG tablet, Take 30 mg by mouth as needed for congestion., Disp: , Rfl:   Allergies:  Allergies  Allergen Reactions  . Tetanus Toxoids Other (See Comments)    High fever and  fainting, this reaction was as an infant.     Past Medical History, Surgical history, Social history, and Family History were reviewed and updated.  Review of Systems: As above  Physical Exam:  height is 5\' 10"  ([id number] m) and weight is 231 lb (104.781 kg). His oral temperature is 97.4 F (36.3 C). His blood pressure is 147/84 and his pulse is 81. His respiration is 16.   Head and neck exam shows no ocular or oral lesions. He has no palpable cervical or supraclavicular lymph nodes. Lungs are clear. Cardiac exam regular rate and rhythm with no murmurs, rubs or bruits.. Abdomen is soft. He has good bowel sounds. There is no fluid wave. There is no palpable liver or spleen. Back exam shows the thoracotomy scar in the right lateral chest wall. No tenderness noted over the spine, ribs or hips. Extremities shows no clubbing cyanosis or edema. Has good strength and range of motion of his joints. Skin exam shows no rashes. Neurological exam is non-focal.  Lab Results  Component Value Date   WBC 7.9 04/05/2016   HGB 15.8 04/05/2016   HCT 46.0 04/05/2016   MCV 84 04/05/2016   PLT 321 04/05/2016     Chemistry      Component Value Date/Time   NA 142 04/05/2016 0756  NA 137 03/24/2015 0810   NA 141 02/22/2010 0801   K 3.9 04/05/2016 0756   K 3.8 03/24/2015 0810   K 4.3 02/22/2010 0801   CL 101 03/24/2015 0810   CL 99 02/22/2010 0801   CO2 31* 04/05/2016 0756   CO2 28 03/24/2015 0810   CO2 30 02/22/2010 0801   BUN 18.1 04/05/2016 0756   BUN 17 03/24/2015 0810   BUN 14 02/22/2010 0801   CREATININE 1.4* 04/05/2016 0756   CREATININE 1.26 03/24/2015 0810   CREATININE 1.3* 02/22/2010 0801      Component Value Date/Time   CALCIUM 9.6 04/05/2016 0756   CALCIUM 9.5 03/24/2015 0810   CALCIUM 9.4 02/22/2010 0801   ALKPHOS 69 04/05/2016 0756   ALKPHOS 82 03/24/2015 0810   ALKPHOS 85* 02/22/2010 0801   AST 15 04/05/2016 0756   AST 15 03/24/2015 0810   AST 23 02/22/2010 0801   ALT 20  04/05/2016 0756   ALT 10 03/24/2015 0810   ALT 23 02/22/2010 0801   BILITOT 0.53 04/05/2016 0756   BILITOT 0.5 03/24/2015 0810   BILITOT 0.70 02/22/2010 0801         Impression and Plan: [name] is 50 year old gentleman gentleman with a history of stage III adenocarcinoma of the distal esophagus. He underwent a resection. He had positive margins and we gave him adjuvant chemotherapy and radiation therapy.  As always, it is a lot fun talking to him. 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   I do not see any issues with recurrent disease. Is now been 11 years. He has done incredibly well.   We will see him back in one more year.   Volanda Napoleon, MD 6/15/201712:29 PM

## 2016-04-06 LAB — VITAMIN D 25 HYDROXY (VIT D DEFICIENCY, FRACTURES): Vitamin D, 25-Hydroxy: 25.3 ng/mL — ABNORMAL LOW (ref 30.0–100.0)

## 2016-04-06 LAB — PSA: Prostate Specific Ag, Serum: 0.6 ng/mL (ref 0.0–4.0)

## 2017-04-03 ENCOUNTER — Ambulatory Visit (HOSPITAL_BASED_OUTPATIENT_CLINIC_OR_DEPARTMENT_OTHER): Payer: BC Managed Care – PPO | Admitting: Hematology & Oncology

## 2017-04-03 ENCOUNTER — Other Ambulatory Visit (HOSPITAL_BASED_OUTPATIENT_CLINIC_OR_DEPARTMENT_OTHER): Payer: BC Managed Care – PPO

## 2017-04-03 VITALS — BP 152/90 | HR 77 | Temp 97.9°F | Resp 16 | Wt 233.0 lb

## 2017-04-03 DIAGNOSIS — Z8501 Personal history of malignant neoplasm of esophagus: Secondary | ICD-10-CM | POA: Diagnosis not present

## 2017-04-03 DIAGNOSIS — E559 Vitamin D deficiency, unspecified: Secondary | ICD-10-CM

## 2017-04-03 DIAGNOSIS — C155 Malignant neoplasm of lower third of esophagus: Secondary | ICD-10-CM

## 2017-04-03 LAB — CMP (CANCER CENTER ONLY)
ALT(SGPT): 25 U/L (ref 10–47)
AST: 26 U/L (ref 11–38)
Albumin: 3.5 g/dL (ref 3.3–5.5)
Alkaline Phosphatase: 80 U/L (ref 26–84)
BUN: 15 mg/dL (ref 7–22)
CHLORIDE: 106 meq/L (ref 98–108)
CO2: 31 meq/L (ref 18–33)
Calcium: 8.8 mg/dL (ref 8.0–10.3)
Creat: 1.3 mg/dl — ABNORMAL HIGH (ref 0.6–1.2)
GLUCOSE: 109 mg/dL (ref 73–118)
Potassium: 4.1 mEq/L (ref 3.3–4.7)
Sodium: 141 mEq/L (ref 128–145)
Total Bilirubin: 0.6 mg/dl (ref 0.20–1.60)
Total Protein: 7.2 g/dL (ref 6.4–8.1)

## 2017-04-03 LAB — CBC WITH DIFFERENTIAL (CANCER CENTER ONLY)
BASO#: 0 10*3/uL (ref 0.0–0.2)
BASO%: 0.4 % (ref 0.0–2.0)
EOS%: 6.7 % (ref 0.0–7.0)
Eosinophils Absolute: 0.5 10*3/uL (ref 0.0–0.5)
HCT: 45.9 % (ref 38.7–49.9)
HGB: 15.5 g/dL (ref 13.0–17.1)
LYMPH#: 1.5 10*3/uL (ref 0.9–3.3)
LYMPH%: 20 % (ref 14.0–48.0)
MCH: 28.7 pg (ref 28.0–33.4)
MCHC: 33.8 g/dL (ref 32.0–35.9)
MCV: 85 fL (ref 82–98)
MONO#: 0.8 10*3/uL (ref 0.1–0.9)
MONO%: 10.6 % (ref 0.0–13.0)
NEUT#: 4.6 10*3/uL (ref 1.5–6.5)
NEUT%: 62.3 % (ref 40.0–80.0)
PLATELETS: 306 10*3/uL (ref 145–400)
RBC: 5.41 10*6/uL (ref 4.20–5.70)
RDW: 13.7 % (ref 11.1–15.7)
WBC: 7.4 10*3/uL (ref 4.0–10.0)

## 2017-04-03 LAB — LACTATE DEHYDROGENASE: LDH: 158 U/L (ref 125–245)

## 2017-04-03 NOTE — Progress Notes (Signed)
Hematology and Oncology Follow Up Visit  KAILYN DUBIE [id number] 02/10/1966 51 y.o. 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  Hochstetler is comes in for his 1 year followup. He is doing quite well. He's working. 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  It is now 12 years since he had his treatment after surgery. He did have chemotherapy and radiation therapy.  There is no dysphasia. He's had no pain. He's had no shortness of breath. There's been no change in bowel or bladder habits. He's had a rashes. He's had no headache.   He's had no issues with headache. He's had some weight loss. He is exercising more.  He's had no bleeding or bruising.   His daughters are doing well. His wife is doing well. She is a Pharmacist, hospital. His eldest daughter is not going off to school. She will start [place] in the late summer. She will major and environmental science  As always, we covered a lot of current topics dealing with sports, politics, social media, etc.  Overall, his performance status is ECOG 0.   Medications:  Current Outpatient Prescriptions:  .  aspirin 81 MG tablet, Take 81 mg by mouth daily.  , Disp: , Rfl:  .  cetirizine (ZYRTEC) 10 MG tablet, Take 10 mg by mouth daily.  , Disp: , Rfl:  .  Cholecalciferol (CVS VIT D 5000 HIGH-POTENCY) 5000 UNITS capsule, Take 5,000 Units by mouth daily.  , Disp: , Rfl:  .  fluticasone (FLONASE) 50 MCG/ACT nasal spray, , Disp: , Rfl:  .  losartan-hydrochlorothiazide (HYZAAR) 100-25 MG per tablet, 100 mg Daily., Disp: , Rfl:  .  Multiple Vitamin (MULTIVITAMIN) tablet, Take 1 tablet by mouth daily.  , Disp: , Rfl:  .  omeprazole (PRILOSEC) 20 MG capsule, Take 20 mg by mouth 2 (two) times daily.  , Disp: , Rfl:  .  pseudoephedrine (SUDAFED) 30 MG tablet, Take 30 mg by mouth as needed for  congestion., Disp: , Rfl:   Allergies:  Allergies  Allergen Reactions  . Tetanus Toxoids Other (See Comments)    High fever and fainting, this reaction was as an infant.     Past Medical History, Surgical history, Social history, and Family History were reviewed and updated.  Review of Systems: As above  Physical Exam:  weight is 233 lb (105.7 kg). His oral temperature is 97.9 F (36.6 C). His blood pressure is 152/90 (abnormal) and his pulse is 77. His respiration is 16 and oxygen saturation is 99%.   Head and neck exam shows no ocular or oral lesions. He has no palpable cervical or supraclavicular lymph nodes. Lungs are clear. Cardiac exam regular rate and rhythm with no murmurs, rubs or bruits.. Abdomen is soft. He has good bowel sounds. There is no fluid wave. There is no palpable liver or spleen. Back exam shows the thoracotomy scar in the right lateral chest wall. No tenderness noted over the spine, ribs or hips. Extremities shows no clubbing cyanosis or edema. Has good strength and range of motion of his joints. Skin exam shows no rashes. Neurological exam is non-focal.  Lab Results  Component Value Date   WBC 7.4 04/03/2017   HGB 15.5 04/03/2017   HCT 45.9 04/03/2017   MCV 85 04/03/2017   PLT  306 04/03/2017     Chemistry      Component Value Date/Time   NA 141 04/03/2017 0801   NA 142 04/05/2016 0756   K 4.1 04/03/2017 0801   K 3.9 04/05/2016 0756   CL 106 04/03/2017 0801   CO2 31 04/03/2017 0801   CO2 31 (H) 04/05/2016 0756   BUN 15 04/03/2017 0801   BUN 18.1 04/05/2016 0756   CREATININE 1.3 (H) 04/03/2017 0801   CREATININE 1.4 (H) 04/05/2016 0756      Component Value Date/Time   CALCIUM 8.8 04/03/2017 0801   CALCIUM 9.6 04/05/2016 0756   ALKPHOS 80 04/03/2017 0801   ALKPHOS 69 04/05/2016 0756   AST 26 04/03/2017 0801   AST 15 04/05/2016 0756   ALT 25 04/03/2017 0801   ALT 20 04/05/2016 0756   BILITOT 0.60 04/03/2017 0801   BILITOT 0.53 04/05/2016 0756          Impression and Plan: Mr. Conroy isA 51 year old white male with stage III adenocarcinoma of the distal esophagus. Marland Kitchen He underwent a resection. He had positive margins and we gave him adjuvant chemotherapy and radiation therapy.  As always, it is a lot fun talking to him. 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 We are talking about the current opioid crisis. It is truly affecting him and his pharmacy.  I do not see any issues with recurrent disease. Is now been 12 years. He has done incredibly well.   I will start here about his older brother who had bladder cancer and passed on.  We will see him back in one more year.   Volanda Napoleon, MD 6/13/20189:31 AM

## 2017-04-05 ENCOUNTER — Ambulatory Visit: Payer: BC Managed Care – PPO | Admitting: Hematology & Oncology

## 2017-04-05 ENCOUNTER — Other Ambulatory Visit: Payer: BC Managed Care – PPO

## 2017-04-23 ENCOUNTER — Other Ambulatory Visit: Payer: BC Managed Care – PPO

## 2017-04-23 ENCOUNTER — Ambulatory Visit: Payer: BC Managed Care – PPO | Admitting: Hematology & Oncology

## 2018-04-02 ENCOUNTER — Other Ambulatory Visit: Payer: Self-pay

## 2018-04-02 ENCOUNTER — Inpatient Hospital Stay: Payer: BC Managed Care – PPO | Attending: Hematology & Oncology

## 2018-04-02 ENCOUNTER — Telehealth: Payer: Self-pay | Admitting: *Deleted

## 2018-04-02 ENCOUNTER — Encounter: Payer: Self-pay | Admitting: Hematology & Oncology

## 2018-04-02 ENCOUNTER — Inpatient Hospital Stay (HOSPITAL_BASED_OUTPATIENT_CLINIC_OR_DEPARTMENT_OTHER): Payer: BC Managed Care – PPO | Admitting: Hematology & Oncology

## 2018-04-02 VITALS — BP 137/71 | HR 76 | Temp 98.1°F | Resp 18 | Wt 234.0 lb

## 2018-04-02 DIAGNOSIS — Z8501 Personal history of malignant neoplasm of esophagus: Secondary | ICD-10-CM

## 2018-04-02 DIAGNOSIS — C155 Malignant neoplasm of lower third of esophagus: Secondary | ICD-10-CM

## 2018-04-02 DIAGNOSIS — C153 Malignant neoplasm of upper third of esophagus: Secondary | ICD-10-CM

## 2018-04-02 DIAGNOSIS — E559 Vitamin D deficiency, unspecified: Secondary | ICD-10-CM

## 2018-04-02 LAB — CMP (CANCER CENTER ONLY)
ALT: 14 U/L (ref 0–55)
ANION GAP: 9 (ref 3–11)
AST: 18 U/L (ref 5–34)
Albumin: 4 g/dL (ref 3.5–5.0)
Alkaline Phosphatase: 101 U/L (ref 40–150)
BUN: 21 mg/dL (ref 7–26)
CALCIUM: 9.7 mg/dL (ref 8.4–10.4)
CO2: 29 mmol/L (ref 22–29)
Chloride: 102 mmol/L (ref 98–109)
Creatinine: 1.36 mg/dL — ABNORMAL HIGH (ref 0.70–1.30)
GFR, Est AFR Am: >60 mL/min (ref >60)
GFR, Estimated: 58 mL/min — ABNORMAL LOW (ref >60)
GLUCOSE: 114 mg/dL (ref 70–140)
Potassium: 4.1 mmol/L (ref 3.5–5.1)
Sodium: 140 mmol/L (ref 136–145)
Total Bilirubin: 0.4 mg/dL (ref 0.2–1.2)
Total Protein: 8.1 g/dL (ref 6.4–8.3)

## 2018-04-02 LAB — CBC WITH DIFFERENTIAL (CANCER CENTER ONLY)
BASOS ABS: 0 10*3/uL (ref 0.0–0.1)
Basophils Relative: 0 %
EOS ABS: 0.6 10*3/uL — AB (ref 0.0–0.5)
Eosinophils Relative: 6 %
HCT: 47.8 % (ref 38.7–49.9)
HEMOGLOBIN: 15.9 g/dL (ref 13.0–17.1)
Lymphocytes Relative: 18 %
Lymphs Abs: 1.7 10*3/uL (ref 0.9–3.3)
MCH: 27.9 pg — ABNORMAL LOW (ref 28.0–33.4)
MCHC: 33.3 g/dL (ref 32.0–35.9)
MCV: 83.9 fL (ref 82.0–98.0)
MONOS PCT: 10 %
Monocytes Absolute: 0.9 10*3/uL (ref 0.1–0.9)
NEUTROS ABS: 6.2 10*3/uL (ref 1.5–6.5)
NEUTROS PCT: 66 %
Platelet Count: 342 10*3/uL (ref 145–400)
RBC: 5.7 MIL/uL (ref 4.20–5.70)
RDW: 13.6 % (ref 11.1–15.7)
WBC: 9.4 10*3/uL (ref 4.0–10.0)

## 2018-04-02 NOTE — Progress Notes (Signed)
Hematology and Oncology Follow Up Visit  [name] [id number] October 05, 1966 52 y.o. 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  Skinner is comes in for his 1 year followup. He is doing quite well. He's working. 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  It is now 15 years since he had his treatment after surgery. He did have chemotherapy and radiation therapy.  There is no dysphasia. He's had no pain. He's had no shortness of breath. There's been no change in bowel or bladder habits. He's had a rashes. He's had no headache.   He's had no issues with headache. He's had some weight loss. He is exercising more.  He's had no bleeding or bruising.   His daughters are doing well. His wife is doing well. She is a Pharmacist, hospital. His eldest daughter is not going off to school. She will start [place] in the late summer. She will major and environmental science  As always, we covered a lot of current topics dealing with sports, politics, social media, etc.  Overall, his performance status is ECOG 0.   Medications:  Current Outpatient Medications:  .  aspirin 81 MG tablet, Take 81 mg by mouth daily.  , Disp: , Rfl:  .  cetirizine (ZYRTEC) 10 MG tablet, Take 10 mg by mouth daily.  , Disp: , Rfl:  .  Cholecalciferol (CVS VIT D 5000 HIGH-POTENCY) 5000 UNITS capsule, Take 5,000 Units by mouth daily.  , Disp: , Rfl:  .  fluticasone (FLONASE) 50 MCG/ACT nasal spray, , Disp: , Rfl:  .  losartan-hydrochlorothiazide (HYZAAR) 100-25 MG per tablet, 100 mg Daily., Disp: , Rfl:  .  Multiple Vitamin (MULTIVITAMIN) tablet, Take 1 tablet by mouth daily.  , Disp: , Rfl:  .  omeprazole (PRILOSEC) 20 MG capsule, Take 20 mg by mouth 2 (two) times daily.  , Disp: , Rfl:  .  pseudoephedrine (SUDAFED) 30 MG tablet, Take 30 mg by mouth as needed for  congestion., Disp: , Rfl:   Allergies:  Allergies  Allergen Reactions  . Tetanus Toxoid, Adsorbed Other (See Comments)    High fever and fainting, this reaction was as an infant.   . Tetanus Toxoids Other (See Comments)    High fever and fainting, this reaction was as an infant.     Past Medical History, Surgical history, Social history, and Family History were reviewed and updated.  Review of Systems: As above  Physical Exam:  weight is 234 lb (106.1 kg). His oral temperature is 98.1 F (36.7 C). His blood pressure is 137/71 and his pulse is 76. His respiration is 18 and oxygen saturation is 97%.   Head and neck exam shows no ocular or oral lesions. He has no palpable cervical or supraclavicular lymph nodes. Lungs are clear. Cardiac exam regular rate and rhythm with no murmurs, rubs or bruits.. Abdomen is soft. He has good bowel sounds. There is no fluid wave. There is no palpable liver or spleen. Back exam shows the thoracotomy scar in the right lateral chest wall. No tenderness noted over the spine, ribs or hips. Extremities shows no clubbing cyanosis or edema. Has good strength and range of motion of his joints. Skin exam shows no rashes. Neurological exam is non-focal.  Lab Results  Component Value Date  WBC 9.4 04/02/2018   HGB 15.9 04/02/2018   HCT 47.8 04/02/2018   MCV 83.9 04/02/2018   PLT 342 04/02/2018     Chemistry      Component Value Date/Time   NA 141 04/03/2017 0801   NA 142 04/05/2016 0756   K 4.1 04/03/2017 0801   K 3.9 04/05/2016 0756   CL 106 04/03/2017 0801   CO2 31 04/03/2017 0801   CO2 31 (H) 04/05/2016 0756   BUN 15 04/03/2017 0801   BUN 18.1 04/05/2016 0756   CREATININE 1.3 (H) 04/03/2017 0801   CREATININE 1.4 (H) 04/05/2016 0756      Component Value Date/Time   CALCIUM 8.8 04/03/2017 0801   CALCIUM 9.6 04/05/2016 0756   ALKPHOS 80 04/03/2017 0801   ALKPHOS 69 04/05/2016 0756   AST 26 04/03/2017 0801   AST 15 04/05/2016 0756   ALT 25  04/03/2017 0801   ALT 20 04/05/2016 0756   BILITOT 0.60 04/03/2017 0801   BILITOT 0.53 04/05/2016 0756         Impression and Plan: [name] isA 52 year old white male with stage III adenocarcinoma of the distal esophagus. Marland Kitchen He underwent a resection. He had positive margins and we gave him adjuvant chemotherapy and radiation therapy.  As always, it is a lot fun talking to him. 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 We are talking about the current opioid crisis. It is truly affecting him and his pharmacy.  I do not see any issues with recurrent disease. Is now been 15 years. He has done incredibly well.   We will see him back in one more year.   Volanda Napoleon, MD 6/12/20198:51 AM

## 2018-04-02 NOTE — Telephone Encounter (Addendum)
Patient's wife is aware of results  ----- Message from Volanda Napoleon, MD sent at 04/02/2018  1:43 PM EDT ----- Call - the labs look ok!!  Renal function is a little low!!  Make sure that you are well hydreated!!  Laurey Arrow

## 2018-04-03 ENCOUNTER — Telehealth: Payer: Self-pay | Admitting: *Deleted

## 2018-04-03 LAB — VITAMIN D 25 HYDROXY (VIT D DEFICIENCY, FRACTURES): VIT D 25 HYDROXY: 26.1 ng/mL — AB (ref 30.0–100.0)

## 2018-04-03 LAB — PROSTATE-SPECIFIC AG, SERUM (LABCORP): PROSTATE SPECIFIC AG, SERUM: 0.6 ng/mL (ref 0.0–4.0)

## 2018-04-03 NOTE — Telephone Encounter (Addendum)
Patient's wife aware. She will pass on information about results and supplement recommendation  ----- Message from Volanda Napoleon, MD sent at 04/03/2018  8:10 AM EDT ----- Call - vit D is low!!  Take 2000 units a day. pete

## 2019-04-01 ENCOUNTER — Ambulatory Visit: Payer: BC Managed Care – PPO | Admitting: Hematology & Oncology

## 2019-04-01 ENCOUNTER — Other Ambulatory Visit: Payer: BC Managed Care – PPO

## 2019-05-20 ENCOUNTER — Other Ambulatory Visit: Payer: Self-pay | Admitting: *Deleted

## 2019-05-20 DIAGNOSIS — E559 Vitamin D deficiency, unspecified: Secondary | ICD-10-CM

## 2019-05-20 DIAGNOSIS — C153 Malignant neoplasm of upper third of esophagus: Secondary | ICD-10-CM

## 2019-05-21 ENCOUNTER — Other Ambulatory Visit: Payer: Self-pay

## 2019-05-21 ENCOUNTER — Inpatient Hospital Stay: Payer: 59 | Attending: Hematology & Oncology

## 2019-05-21 ENCOUNTER — Encounter: Payer: Self-pay | Admitting: Hematology & Oncology

## 2019-05-21 ENCOUNTER — Inpatient Hospital Stay (HOSPITAL_BASED_OUTPATIENT_CLINIC_OR_DEPARTMENT_OTHER): Payer: 59 | Admitting: Hematology & Oncology

## 2019-05-21 VITALS — BP 161/86 | HR 82 | Temp 97.3°F | Resp 17 | Wt 217.0 lb

## 2019-05-21 DIAGNOSIS — Z8501 Personal history of malignant neoplasm of esophagus: Secondary | ICD-10-CM

## 2019-05-21 DIAGNOSIS — E559 Vitamin D deficiency, unspecified: Secondary | ICD-10-CM

## 2019-05-21 DIAGNOSIS — Z923 Personal history of irradiation: Secondary | ICD-10-CM | POA: Diagnosis not present

## 2019-05-21 DIAGNOSIS — C155 Malignant neoplasm of lower third of esophagus: Secondary | ICD-10-CM

## 2019-05-21 DIAGNOSIS — Z9221 Personal history of antineoplastic chemotherapy: Secondary | ICD-10-CM | POA: Diagnosis not present

## 2019-05-21 DIAGNOSIS — C153 Malignant neoplasm of upper third of esophagus: Secondary | ICD-10-CM

## 2019-05-21 LAB — CBC WITH DIFFERENTIAL (CANCER CENTER ONLY)
Abs Immature Granulocytes: 0.01 10*3/uL (ref 0.00–0.07)
Basophils Absolute: 0.1 10*3/uL (ref 0.0–0.1)
Basophils Relative: 1 %
Eosinophils Absolute: 0.4 10*3/uL (ref 0.0–0.5)
Eosinophils Relative: 5 %
HCT: 47.4 % (ref 39.0–52.0)
Hemoglobin: 15.6 g/dL (ref 13.0–17.0)
Immature Granulocytes: 0 %
Lymphocytes Relative: 18 %
Lymphs Abs: 1.4 10*3/uL (ref 0.7–4.0)
MCH: 27.7 pg (ref 26.0–34.0)
MCHC: 32.9 g/dL (ref 30.0–36.0)
MCV: 84.2 fL (ref 80.0–100.0)
Monocytes Absolute: 0.8 10*3/uL (ref 0.1–1.0)
Monocytes Relative: 10 %
Neutro Abs: 5.4 10*3/uL (ref 1.7–7.7)
Neutrophils Relative %: 66 %
Platelet Count: 286 10*3/uL (ref 150–400)
RBC: 5.63 MIL/uL (ref 4.22–5.81)
RDW: 13 % (ref 11.5–15.5)
WBC Count: 8 10*3/uL (ref 4.0–10.5)
nRBC: 0 % (ref 0.0–0.2)

## 2019-05-21 LAB — COMPREHENSIVE METABOLIC PANEL
ALT: 12 U/L (ref 0–44)
AST: 15 U/L (ref 15–41)
Albumin: 4 g/dL (ref 3.5–5.0)
Alkaline Phosphatase: 89 U/L (ref 38–126)
Anion gap: 7 (ref 5–15)
BUN: 18 mg/dL (ref 6–20)
CO2: 31 mmol/L (ref 22–32)
Calcium: 8.9 mg/dL (ref 8.9–10.3)
Chloride: 101 mmol/L (ref 98–111)
Creatinine, Ser: 1.33 mg/dL — ABNORMAL HIGH (ref 0.61–1.24)
GFR calc Af Amer: >60 mL/min (ref >60)
GFR calc non Af Amer: 60 mL/min — ABNORMAL LOW (ref >60)
Glucose, Bld: 113 mg/dL — ABNORMAL HIGH (ref 70–99)
Potassium: 4.2 mmol/L (ref 3.5–5.1)
Sodium: 139 mmol/L (ref 135–145)
Total Bilirubin: 0.6 mg/dL (ref 0.3–1.2)
Total Protein: 7.1 g/dL (ref 6.5–8.1)

## 2019-05-21 NOTE — Progress Notes (Signed)
Hematology and Oncology Follow Up Visit  [name] [id number] 04-05-1966 52 y.o. 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 He is doing quite well.  He is managing to be careful with the corona virus.  He is still working.  He is very busy at work.  His family is doing okay.  I think 1 of his kids goes up to [employer].  I am not sure exactly what the situation is what college football.  It sounds like his sister is not doing well.  She has metastatic kidney cancer.  She is being treated up at the [facility] up in [place].  Hopefully, I am sure that she is getting fantastic care.  It is now 16 years since he had his treatment after surgery. He did have chemotherapy and radiation therapy.  There is no dysphasia. He's had no pain. He's had no shortness of breath. There's been no change in bowel or bladder habits. He's had a rashes. He's had no headache.   He's had no issues with headache. He's had some weight loss. He is exercising more.  He's had no bleeding or bruising.   As always, we covered a lot of current topics dealing with sports, politics, social media, etc.  Overall, his performance status is ECOG 0.   Medications:  Current Outpatient Medications:  .  aspirin 81 MG tablet, Take 81 mg by mouth daily.  , Disp: , Rfl:  .  cetirizine (ZYRTEC) 10 MG tablet, Take 10 mg by mouth daily.  , Disp: , Rfl:  .  Cholecalciferol (CVS VIT D 5000 HIGH-POTENCY) 5000 UNITS capsule, Take 5,000 Units by mouth daily.  , Disp: , Rfl:  .  fluticasone (FLONASE) 50 MCG/ACT nasal spray, , Disp: , Rfl:  .  losartan-hydrochlorothiazide (HYZAAR) 100-25 MG per tablet, 100 mg Daily., Disp: , Rfl:  .  Multiple Vitamin (MULTIVITAMIN) tablet, Take 1 tablet by mouth daily.  , Disp: , Rfl:  .  omeprazole (PRILOSEC) 20 MG capsule, Take 20 mg by  mouth 2 (two) times daily.  , Disp: , Rfl:  .  pseudoephedrine (SUDAFED) 30 MG tablet, Take 30 mg by mouth as needed for congestion., Disp: , Rfl:   Allergies:  Allergies  Allergen Reactions  . Tetanus Toxoid, Adsorbed Other (See Comments)    High fever and fainting, this reaction was as an infant.   . Tetanus Toxoids Other (See Comments)    High fever and fainting, this reaction was as an infant.     Past Medical History, Surgical history, Social history, and Family History were reviewed and updated.  Review of Systems: Review of Systems  Constitutional: Negative.   HENT: Negative.   Eyes: Negative.   Respiratory: Negative.   Cardiovascular: Negative.   Gastrointestinal: Negative.   Genitourinary: Negative.   Musculoskeletal: Negative.   Skin: Negative.   Neurological: Negative.   Endo/Heme/Allergies: Negative.   Psychiatric/Behavioral: Negative.      Physical Exam:  weight is 217 lb (98.4 kg). His temporal temperature is 97.3 F (36.3 C) (abnormal). His blood pressure is 161/86 (abnormal) and his pulse is 82. His respiration is 17 and oxygen saturation is 100%.   Physical Exam Vitals signs reviewed.  HENT:     Head: Normocephalic and atraumatic.  Eyes:     Pupils:  Pupils are equal, round, and reactive to light.  Neck:     Musculoskeletal: Normal range of motion.  Cardiovascular:     Rate and Rhythm: Normal rate and regular rhythm.     Heart sounds: Normal heart sounds.  Pulmonary:     Effort: Pulmonary effort is normal.     Breath sounds: Normal breath sounds.  Abdominal:     General: Bowel sounds are normal.     Palpations: Abdomen is soft.  Musculoskeletal: Normal range of motion.        General: No tenderness or deformity.  Lymphadenopathy:     Cervical: No cervical adenopathy.  Skin:    General: Skin is warm and dry.     Findings: No erythema or rash.  Neurological:     Mental Status: He is alert and oriented to person, place, and time.  Psychiatric:         Behavior: Behavior normal.        Thought Content: Thought content normal.        Judgment: Judgment normal.      Lab Results  Component Value Date   WBC 8.0 05/21/2019   HGB 15.6 05/21/2019   HCT 47.4 05/21/2019   MCV 84.2 05/21/2019   PLT 286 05/21/2019     Chemistry      Component Value Date/Time   NA 139 05/21/2019 0754   NA 141 04/03/2017 0801   NA 142 04/05/2016 0756   K 4.2 05/21/2019 0754   K 4.1 04/03/2017 0801   K 3.9 04/05/2016 0756   CL 101 05/21/2019 0754   CL 106 04/03/2017 0801   CO2 31 05/21/2019 0754   CO2 31 04/03/2017 0801   CO2 31 (H) 04/05/2016 0756   BUN 18 05/21/2019 0754   BUN 15 04/03/2017 0801   BUN 18.1 04/05/2016 0756   CREATININE 1.33 (H) 05/21/2019 0754   CREATININE 1.36 (H) 04/02/2018 0743   CREATININE 1.3 (H) 04/03/2017 0801   CREATININE 1.4 (H) 04/05/2016 0756      Component Value Date/Time   CALCIUM 8.9 05/21/2019 0754   CALCIUM 8.8 04/03/2017 0801   CALCIUM 9.6 04/05/2016 0756   ALKPHOS 89 05/21/2019 0754   ALKPHOS 80 04/03/2017 0801   ALKPHOS 69 04/05/2016 0756   AST 15 05/21/2019 0754   AST 18 04/02/2018 0743   AST 15 04/05/2016 0756   ALT 12 05/21/2019 0754   ALT 14 04/02/2018 0743   ALT 25 04/03/2017 0801   ALT 20 04/05/2016 0756   BILITOT 0.6 05/21/2019 0754   BILITOT 0.4 04/02/2018 0743   BILITOT 0.53 04/05/2016 0756         Impression and Plan: [name] is a 53 year old white male with stage III adenocarcinoma of the distal esophagus. Marland Kitchen He underwent a resection. He had positive margins and we gave him adjuvant chemotherapy and radiation therapy.  As always, it is a lot fun talking to him. 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   I do not see any issues with recurrent disease. Is now been 16 years. He has done incredibly well.   His blood pressure is up a little bit.  He says this  happens when he comes to see [place].  He is very diligent with checking his pressure at home.  We will see him back in one more year.   Collier Salina  Oletha Cruel, MD 7/30/20205:08 PM

## 2019-05-22 LAB — VITAMIN D 25 HYDROXY (VIT D DEFICIENCY, FRACTURES): Vit D, 25-Hydroxy: 30.8 ng/mL (ref 30.0–100.0)

## 2020-05-19 ENCOUNTER — Inpatient Hospital Stay: Payer: 59 | Admitting: Hematology & Oncology

## 2020-05-19 ENCOUNTER — Inpatient Hospital Stay: Payer: 59
